# Patient Record
Sex: Female | Born: 1975 | Race: Black or African American | Hispanic: No | Marital: Single | Smoking: Former smoker
Health system: Southern US, Community
[De-identification: ages and names within clinical notes are randomized; demographics above are authoritative.]

---

## 2017-03-21 ENCOUNTER — Ambulatory Visit (INDEPENDENT_AMBULATORY_CARE_PROVIDER_SITE_OTHER): Payer: PRIVATE HEALTH INSURANCE | Admitting: Family Medicine

## 2017-03-21 ENCOUNTER — Encounter: Payer: Self-pay | Admitting: Family Medicine

## 2017-03-21 ENCOUNTER — Other Ambulatory Visit (HOSPITAL_COMMUNITY)
Admission: RE | Admit: 2017-03-21 | Discharge: 2017-03-21 | Disposition: A | Discharge status: Home / Self Care | Payer: PRIVATE HEALTH INSURANCE | Source: Ambulatory Visit | Attending: Family Medicine | Admitting: Family Medicine

## 2017-03-21 VITALS — BP 134/85 | HR 65 | Ht 65.0 in | Wt 219.0 lb

## 2017-03-21 DIAGNOSIS — Z01411 Encounter for gynecological examination (general) (routine) with abnormal findings: Secondary | ICD-10-CM | POA: Diagnosis not present

## 2017-03-21 DIAGNOSIS — R350 Frequency of micturition: Secondary | ICD-10-CM

## 2017-03-21 DIAGNOSIS — L02215 Cutaneous abscess of perineum: Secondary | ICD-10-CM

## 2017-03-21 DIAGNOSIS — Z01419 Encounter for gynecological examination (general) (routine) without abnormal findings: Secondary | ICD-10-CM | POA: Insufficient documentation

## 2017-03-21 MED ORDER — SULFAMETHOXAZOLE-TRIMETHOPRIM 800-160 MG PO TABS: 1.0000 | ORAL_TABLET | Freq: Two times a day (BID) | ORAL | 1 refills | Status: DC

## 2017-03-21 NOTE — Progress Notes (Signed)
GYNECOLOGY ANNUAL PREVENTATIVE CARE ENCOUNTER NOTE  Subjective:   Geannie Risenatricia Tinkey is a 42 y.o. 233P1 female here for a routine annual gynecologic exam.  Current complaints: sore bump on outside of vagina on left. Started 2-3 days ago, ruptured last night with puss and blood.   Denies abnormal vaginal bleeding, discharge, pelvic pain, problems with intercourse or other gynecologic concerns.    Gynecologic History Patient's last menstrual period was 03/07/2017. Patient is sexually active  Contraception: condoms Last Pap: several years ago. Results were: normal Last mammogram: n/a.  Obstetric History OB History  Gravida Para Term Preterm AB Living  3 1          SAB TAB Ectopic Multiple Live Births          1    # Outcome Date GA Lbr Len/2nd Weight Sex Delivery Anes PTL Lv  3 Gravida           2 Gravida           1 Para      Vag-Spont         History reviewed. No pertinent past medical history.  History reviewed. No pertinent surgical history.  No current outpatient medications on file prior to visit.   No current facility-administered medications on file prior to visit.     No Known Allergies  Social History   Socioeconomic History  . Marital status: Married    Spouse name: Not on file  . Number of children: Not on file  . Years of education: Not on file  . Highest education level: Not on file  Social Needs  . Financial resource strain: Not on file  . Food insecurity - worry: Not on file  . Food insecurity - inability: Not on file  . Transportation needs - medical: Not on file  . Transportation needs - non-medical: Not on file  Occupational History  . Not on file  Tobacco Use  . Smoking status: Former Smoker    Last attempt to quit: 03/21/1997    Years since quitting: 20.0  . Smokeless tobacco: Never Used  Substance and Sexual Activity  . Alcohol use: Yes  . Drug use: No  . Sexual activity: Yes  Other Topics Concern  . Not on file  Social History  Narrative  . Not on file    Family History  Problem Relation Age of Onset  . Hypertension Sister   . Cancer Neg Hx   . Diabetes Neg Hx     The following portions of the patient's history were reviewed and updated as appropriate: allergies, current medications, past family history, past medical history, past social history, past surgical history and problem list.  Review of Systems Pertinent items noted in HPI and remainder of comprehensive ROS otherwise negative.   Objective:  BP 134/85   Pulse 65   Ht 5\' 5"  (1.651 m)   Wt 219 lb (99.3 kg)   LMP 03/07/2017   BMI 36.44 kg/m  CONSTITUTIONAL: Well-developed, well-nourished female in no acute distress.  HENT:  Normocephalic, atraumatic, External right and left ear normal. Oropharynx is clear and moist EYES: Conjunctivae and EOM are normal. Pupils are equal, round, and reactive to light. No scleral icterus.  NECK: Normal range of motion, supple, no masses.  Normal thyroid.   CARDIOVASCULAR: Normal heart rate noted, regular rhythm RESPIRATORY: Clear to auscultation bilaterally. Effort and breath sounds normal, no problems with respiration noted. BREASTS: Symmetric in size. No masses, skin changes, nipple drainage, or  lymphadenopathy. ABDOMEN: Soft, normal bowel sounds, no distention noted.  No tenderness, rebound or guarding.  PELVIC: 3cm firm, indurated area on left perineum with erythema to skin; normal appearing vaginal mucosa and cervix.  No abnormal discharge noted.  Pap smear obtained.  Normal uterine size, no other palpable masses, no uterine or adnexal tenderness.  MUSCULOSKELETAL: Normal range of motion. No tenderness.  No cyanosis, clubbing, or edema.  2+ distal pulses. SKIN: Skin is warm and dry. No rash noted. Not diaphoretic. No erythema. No pallor. NEUROLOGIC: Alert and oriented to person, place, and time. Normal reflexes, muscle tone coordination. No cranial nerve deficit noted. PSYCHIATRIC: Normal mood and affect.  Normal behavior. Normal judgment and thought content.  Assessment:  Annual gynecologic examination with pap smear   Plan:  1. Well woman exam with routine gynecological exam Will follow up results of pap smear and manage accordingly. Mammogram scheduled STD testing discussed. Patient requested testing: vaginal cultures and blood testing. Discussed exercise and diet - Cytology - PAP - MM Digital Screening; Future - HIV antibody (with reflex) - Hepatitis B Surface AntiGEN - Hepatitis C Antibody - CBC - TSH - Comprehensive metabolic panel - RPR  2. Urinary frequency - Urine Culture  3. Perineal abscess Bactrim prescribed as continued induration and mild cellulitis. Discussed warm compresses.   Routine preventative health maintenance measures emphasized. Please refer to After Visit Summary for other counseling recommendations.    Candelaria Celeste, DO [facility]

## 2017-03-21 NOTE — Patient Instructions (Signed)

## 2017-03-22 LAB — COMPREHENSIVE METABOLIC PANEL
ALK PHOS: 67 IU/L (ref 39–117)
ALT: 7 IU/L (ref 0–32)
AST: 15 IU/L (ref 0–40)
Albumin/Globulin Ratio: 1.3 (ref 1.2–2.2)
Albumin: 4.1 g/dL (ref 3.5–5.5)
BILIRUBIN TOTAL: 0.3 mg/dL (ref 0.0–1.2)
BUN/Creatinine Ratio: 8 — ABNORMAL LOW (ref 9–23)
BUN: 6 mg/dL (ref 6–24)
CO2: 23 mmol/L (ref 20–29)
CREATININE: 0.75 mg/dL (ref 0.57–1.00)
Calcium: 9.2 mg/dL (ref 8.7–10.2)
Chloride: 103 mmol/L (ref 96–106)
GFR calc Af Amer: 114 mL/min/{1.73_m2} (ref >59)
GFR calc non Af Amer: 99 mL/min/{1.73_m2} (ref >59)
GLOBULIN, TOTAL: 3.1 g/dL (ref 1.5–4.5)
Glucose: 88 mg/dL (ref 65–99)
Potassium: 4.3 mmol/L (ref 3.5–5.2)
Sodium: 140 mmol/L (ref 134–144)
Total Protein: 7.2 g/dL (ref 6.0–8.5)

## 2017-03-22 LAB — HEPATITIS B SURFACE ANTIGEN: Hepatitis B Surface Ag: NEGATIVE

## 2017-03-22 LAB — CBC
HEMATOCRIT: 37.2 % (ref 34.0–46.6)
Hemoglobin: 12.1 g/dL (ref 11.1–15.9)
MCH: 28.7 pg (ref 26.6–33.0)
MCHC: 32.5 g/dL (ref 31.5–35.7)
MCV: 88 fL (ref 79–97)
Platelets: 358 10*3/uL (ref 150–379)
RBC: 4.21 x10E6/uL (ref 3.77–5.28)
RDW: 14.7 % (ref 12.3–15.4)
WBC: 6.1 10*3/uL (ref 3.4–10.8)

## 2017-03-22 LAB — TSH: TSH: 1.81 u[IU]/mL (ref 0.450–4.500)

## 2017-03-22 LAB — RPR: RPR: NONREACTIVE

## 2017-03-22 LAB — HEPATITIS C ANTIBODY: Hep C Virus Ab: <0.1 s/co ratio (ref 0.0–0.9)

## 2017-03-22 LAB — HIV ANTIBODY (ROUTINE TESTING W REFLEX): HIV Screen 4th Generation wRfx: NONREACTIVE

## 2017-03-23 LAB — URINE CULTURE

## 2017-03-24 ENCOUNTER — Other Ambulatory Visit: Payer: Self-pay | Admitting: Family Medicine

## 2017-03-24 ENCOUNTER — Encounter: Payer: Self-pay | Admitting: Family Medicine

## 2017-03-24 MED ORDER — CIPROFLOXACIN HCL 500 MG PO TABS: 500.0000 mg | ORAL_TABLET | Freq: Two times a day (BID) | ORAL | 0 refills | Status: DC

## 2017-03-29 ENCOUNTER — Ambulatory Visit (HOSPITAL_BASED_OUTPATIENT_CLINIC_OR_DEPARTMENT_OTHER)
Admission: RE | Admit: 2017-03-29 | Discharge: 2017-03-29 | Disposition: A | Discharge status: Home / Self Care | Payer: PRIVATE HEALTH INSURANCE | Source: Ambulatory Visit | Attending: Family Medicine | Admitting: Family Medicine

## 2017-03-29 DIAGNOSIS — Z01419 Encounter for gynecological examination (general) (routine) without abnormal findings: Secondary | ICD-10-CM

## 2017-03-29 DIAGNOSIS — Z1231 Encounter for screening mammogram for malignant neoplasm of breast: Secondary | ICD-10-CM | POA: Diagnosis not present

## 2017-03-31 DIAGNOSIS — Z01419 Encounter for gynecological examination (general) (routine) without abnormal findings: Secondary | ICD-10-CM | POA: Diagnosis not present

## 2017-04-01 ENCOUNTER — Other Ambulatory Visit: Payer: Self-pay | Admitting: Family Medicine

## 2017-04-01 LAB — CYTOLOGY - PAP
Adequacy: ABSENT
CHLAMYDIA, DNA PROBE: NEGATIVE
Diagnosis: NEGATIVE
HPV (WINDOPATH): NOT DETECTED
Neisseria Gonorrhea: NEGATIVE

## 2017-04-01 MED ORDER — METRONIDAZOLE 500 MG PO TABS: 500.0000 mg | ORAL_TABLET | Freq: Two times a day (BID) | ORAL | 0 refills | Status: DC

## 2017-04-02 ENCOUNTER — Telehealth: Payer: Self-pay

## 2017-04-02 NOTE — Telephone Encounter (Signed)
Left message for patient to return call to the clinic for results. Alfons Sulkowski RNBSN 

## 2017-04-02 NOTE — Telephone Encounter (Signed)
Patient called back and made aware of trichomonias on pap smear. Patient made aware that she needs to get Flagyl and take medication for treatment. Patient made aware to avoid alchol while taking Flagyl.  Patient questioned how long she has had the infection and made her aware that we dont know that information from the result. We only know that she has it. Patient made aware to abstain from sex 2 weeks after taking antibiotic and her partner should be tested. Armandina StammerJennifer Howard RNBSN

## 2017-04-20 ENCOUNTER — Encounter: Payer: Self-pay | Admitting: Family Medicine

## 2017-04-21 ENCOUNTER — Other Ambulatory Visit: Payer: Self-pay

## 2017-04-21 MED ORDER — FLUCONAZOLE 150 MG PO TABS: 150.0000 mg | ORAL_TABLET | Freq: Once | ORAL | 1 refills | Status: AC

## 2017-05-15 ENCOUNTER — Encounter: Payer: Self-pay | Admitting: Family Medicine

## 2017-08-18 ENCOUNTER — Encounter: Payer: Self-pay | Admitting: Family Medicine

## 2017-08-19 ENCOUNTER — Ambulatory Visit: Payer: PRIVATE HEALTH INSURANCE

## 2018-03-26 ENCOUNTER — Other Ambulatory Visit: Payer: Self-pay

## 2018-03-26 ENCOUNTER — Emergency Department (HOSPITAL_BASED_OUTPATIENT_CLINIC_OR_DEPARTMENT_OTHER)
Admission: EM | Admit: 2018-03-26 | Discharge: 2018-03-26 | Disposition: A | Discharge status: Home / Self Care | Payer: No Typology Code available for payment source | Attending: Emergency Medicine | Admitting: Emergency Medicine

## 2018-03-26 ENCOUNTER — Emergency Department (HOSPITAL_BASED_OUTPATIENT_CLINIC_OR_DEPARTMENT_OTHER): Payer: No Typology Code available for payment source

## 2018-03-26 ENCOUNTER — Encounter (HOSPITAL_BASED_OUTPATIENT_CLINIC_OR_DEPARTMENT_OTHER): Payer: Self-pay

## 2018-03-26 DIAGNOSIS — Y999 Unspecified external cause status: Secondary | ICD-10-CM | POA: Insufficient documentation

## 2018-03-26 DIAGNOSIS — Y929 Unspecified place or not applicable: Secondary | ICD-10-CM | POA: Diagnosis not present

## 2018-03-26 DIAGNOSIS — M25512 Pain in left shoulder: Secondary | ICD-10-CM | POA: Insufficient documentation

## 2018-03-26 DIAGNOSIS — Y9389 Activity, other specified: Secondary | ICD-10-CM | POA: Diagnosis not present

## 2018-03-26 DIAGNOSIS — R0789 Other chest pain: Secondary | ICD-10-CM | POA: Insufficient documentation

## 2018-03-26 DIAGNOSIS — S7012XA Contusion of left thigh, initial encounter: Secondary | ICD-10-CM | POA: Diagnosis not present

## 2018-03-26 DIAGNOSIS — S161XXA Strain of muscle, fascia and tendon at neck level, initial encounter: Secondary | ICD-10-CM | POA: Diagnosis not present

## 2018-03-26 DIAGNOSIS — S0990XA Unspecified injury of head, initial encounter: Secondary | ICD-10-CM | POA: Diagnosis present

## 2018-03-26 DIAGNOSIS — Z87891 Personal history of nicotine dependence: Secondary | ICD-10-CM | POA: Insufficient documentation

## 2018-03-26 MED ORDER — METHOCARBAMOL 500 MG PO TABS: 500.0000 mg | ORAL_TABLET | Freq: Three times a day (TID) | ORAL | 0 refills | Status: DC | PRN
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
## 2018-03-26 NOTE — ED Notes (Signed)
Patient transported to CT 
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
## 2018-03-26 NOTE — ED Notes (Signed)
ED Provider at bedside. 

## 2018-03-26 NOTE — ED Notes (Signed)
Returned from xray

## 2018-03-26 NOTE — Discharge Instructions (Signed)

## 2018-09-06 ENCOUNTER — Emergency Department (HOSPITAL_COMMUNITY)
Admission: EM | Admit: 2018-09-06 | Discharge: 2018-09-06 | Disposition: A | Discharge status: Home / Self Care | Payer: Self-pay | Attending: Emergency Medicine | Admitting: Emergency Medicine

## 2018-09-06 ENCOUNTER — Other Ambulatory Visit: Payer: Self-pay

## 2018-09-06 ENCOUNTER — Emergency Department (HOSPITAL_COMMUNITY): Payer: Self-pay

## 2018-09-06 ENCOUNTER — Encounter (HOSPITAL_COMMUNITY): Payer: Self-pay | Admitting: Emergency Medicine

## 2018-09-06 DIAGNOSIS — Y9301 Activity, walking, marching and hiking: Secondary | ICD-10-CM | POA: Insufficient documentation

## 2018-09-06 DIAGNOSIS — Y929 Unspecified place or not applicable: Secondary | ICD-10-CM | POA: Insufficient documentation

## 2018-09-06 DIAGNOSIS — S93402A Sprain of unspecified ligament of left ankle, initial encounter: Secondary | ICD-10-CM

## 2018-09-06 DIAGNOSIS — X501XXA Overexertion from prolonged static or awkward postures, initial encounter: Secondary | ICD-10-CM | POA: Insufficient documentation

## 2018-09-06 DIAGNOSIS — Y999 Unspecified external cause status: Secondary | ICD-10-CM | POA: Insufficient documentation

## 2018-09-06 DIAGNOSIS — Z87891 Personal history of nicotine dependence: Secondary | ICD-10-CM | POA: Insufficient documentation

## 2018-09-06 DIAGNOSIS — S93492A Sprain of other ligament of left ankle, initial encounter: Secondary | ICD-10-CM | POA: Insufficient documentation

## 2018-09-06 MED ORDER — IBUPROFEN 800 MG PO TABS: 800.0000 mg | ORAL_TABLET | Freq: Three times a day (TID) | ORAL | 0 refills | Status: DC | PRN

## 2018-09-06 NOTE — ED Provider Notes (Signed)
[facility] EMERGENCY DEPARTMENT Provider Note   CSN: [id number] Arrival date & time: 09/06/18  1456     History   Chief Complaint Chief Complaint  Patient presents with  . Ankle Pain    HPI Yvette Campos is a 43 y.o. female.     HPI Patient presents to the emergency department with a left ankle injury that occurred Friday night.  Patient states that she stepped wrong down on the last step in her ankle twisted outward.  Patient states that she had immediate swelling and discomfort.  She states she is having a hard time putting weight on it due to pain.  Patient states that certain movements palpation make the pain worse.  Patient states that she did not take any medications prior to arrival for her symptoms.  Patient denies any other injuries. History reviewed. No pertinent past medical history.  There are no active problems to display for this patient.   History reviewed. No pertinent surgical history.   OB History    Gravida  3   Para  1   Term      Preterm      AB      Living        SAB      TAB      Ectopic      Multiple      Live Births  1            Home Medications    Prior to Admission medications   Medication Sig Start Date End Date Taking? Authorizing Provider  ciprofloxacin (CIPRO) 500 MG tablet Take 1 tablet (500 mg total) by mouth 2 (two) times daily. 03/24/17   Truett Mainland, DO  methocarbamol (ROBAXIN) 500 MG tablet Take 1 tablet (500 mg total) by mouth every 8 (eight) hours as needed for muscle spasms. 03/26/18   Long, Wonda Olds, MD  metroNIDAZOLE (FLAGYL) 500 MG tablet Take 1 tablet (500 mg total) by mouth 2 (two) times daily. 04/01/17   Truett Mainland, DO  sulfamethoxazole-trimethoprim (BACTRIM DS,SEPTRA DS) 800-160 MG tablet Take 1 tablet by mouth 2 (two) times daily. 03/21/17   Truett Mainland, DO    Family History Family History  Problem Relation Age of Onset  . Hypertension Sister   . Cancer Neg Hx   . Diabetes Neg Hx      Social History Social History   Tobacco Use  . Smoking status: Former Smoker    Quit date: 03/21/1997    Years since quitting: 21.4  . Smokeless tobacco: Never Used  Substance Use Topics  . Alcohol use: Yes    Comment: occ  . Drug use: No     Allergies   Patient has no known allergies.   Review of Systems Review of Systems All other systems negative except as documented in the HPI. All pertinent positives and negatives as reviewed in the HPI.  Physical Exam Updated Vital Signs BP (!) 130/99 (BP Location: Right Arm)   Pulse 86   Temp 98 F (36.7 C) (Oral)   Resp 18   Ht 5\' 5"  (1.651 m)   Wt 79.4 kg   LMP 08/18/2018   SpO2 100%   BMI 29.12 kg/m   Physical Exam Vitals signs and nursing note reviewed.  Constitutional:      General: She is not in acute distress.    Appearance: She is well-developed.  HENT:     Head: Normocephalic and atraumatic.  Eyes:  Pupils: Pupils are equal, round, and reactive to light.  Pulmonary:     Effort: Pulmonary effort is normal.  Musculoskeletal:     Left ankle: She exhibits decreased range of motion and swelling. She exhibits no deformity and normal pulse. Tenderness. Lateral malleolus and medial malleolus tenderness found. Achilles tendon normal.  Skin:    General: Skin is warm and dry.  Neurological:     Mental Status: She is alert and oriented to person, place, and time.      ED Treatments / Results  Labs (all labs ordered are listed, but only abnormal results are displayed) Labs Reviewed - No data to display  EKG None  Radiology Dg Ankle Complete Left  Result Date: 09/06/2018 CLINICAL DATA:  FALL, LEFT ANKLE PAIN, PER ER NOTE, Pt states that she fell yesterday and hurt her left ankle EXAM: LEFT ANKLE COMPLETE - 3+ VIEW COMPARISON:  None. FINDINGS: There is diffuse soft tissue swelling of the ankle. No acute fracture or subluxation. IMPRESSION: Soft tissue swelling. Electronically Signed   By: Norva PavlovElizabeth  Brown  M.D.   On: 09/06/2018 15:33    Procedures Procedures (including critical care time)  Medications Ordered in ED Medications - No data to display   Initial Impression / Assessment and Plan / ED Course  I have reviewed the triage vital signs and the nursing notes.  Pertinent labs & imaging results that were available during my care of the patient were reviewed by me and considered in my medical decision making (see chart for details).        Patient has a sprain of her left ankle.  Patient will be advised ice and elevate the ankle.  I told her to follow-up with orthopedics if not improving over the next 2 weeks.  Patient is advised to return here for any worsening in her condition.  Final Clinical Impressions(s) / ED Diagnoses   Final diagnoses:  None    ED Discharge Orders    None       Charlestine NightLawyer, Reyn Faivre, PA-C 09/06/18 1541    Terrilee FilesButler, Michael C, MD 09/07/18 1001

## 2018-09-06 NOTE — ED Triage Notes (Signed)
Pt states that she fell yesterday and hurt her left ankle

## 2018-09-06 NOTE — Discharge Instructions (Addendum)
Return here as needed.  Follow-up with the orthopedist provided.  Ice and elevate your ankle.

## 2018-10-08 ENCOUNTER — Telehealth: Payer: Medicaid Other | Admitting: Physician Assistant

## 2018-10-08 DIAGNOSIS — R52 Pain, unspecified: Secondary | ICD-10-CM

## 2018-10-08 DIAGNOSIS — R509 Fever, unspecified: Secondary | ICD-10-CM

## 2018-10-08 DIAGNOSIS — R0602 Shortness of breath: Secondary | ICD-10-CM

## 2018-10-08 MED ORDER — ALBUTEROL SULFATE HFA 108 (90 BASE) MCG/ACT IN AERS: 2.0000 | INHALATION_SPRAY | RESPIRATORY_TRACT | 0 refills | Status: DC | PRN

## 2018-10-08 NOTE — Progress Notes (Signed)
E-Visit for Corona Virus Screening   Your current symptoms could be consistent with the coronavirus.  Many health care providers can now test patients at their office but not all are.  [place] has multiple testing sites. For information on our COVID testing locations and hours go to [email or web address]  Please quarantine yourself while awaiting your test results.  We are enrolling you in our MyChart Home Montioring for COVID19 . Daily you will receive a questionnaire within the MyChart website. Our COVID 19 response team willl be monitoriing your responses daily.    COVID-19 is a respiratory illness with symptoms that are similar to the flu. Symptoms are typically mild to moderate, but there have been cases of severe illness and death due to the virus. The following symptoms may appear 2-14 days after exposure: . Fever . Cough . Shortness of breath or difficulty breathing . Chills . Repeated shaking with chills . Muscle pain . Headache . Sore throat . New loss of taste or smell . Fatigue . Congestion or runny nose . Nausea or vomiting . Diarrhea  It is vitally important that if you feel that you have an infection such as this virus or any other virus that you stay home and away from places where you may spread it to others.  You should self-quarantine for 14 days if you have symptoms that could potentially be coronavirus or have been in close contact a with a person diagnosed with COVID-19 within the last 2 weeks. You should avoid contact with people age 43 and older.   You should wear a mask or cloth face covering over your nose and mouth if you must be around other people or animals, including pets (even at home). Try to stay at least 6 feet away from other people. This will protect the people around you.  You can use medication such as A prescription inhaler called Albuterol MDI 90 mcg /actuation 2 puffs every 4 hours as needed for shortness of breath,  wheezing, cough   If you have or develop a cough, you may use cough suppressants such as Delsym and Robitussin.  If you have glaucoma or high blood pressure, you can also use Coricidin HBP.  You may also take acetaminophen (Tylenol) as needed for fever.   Reduce your risk of any infection by using the same precautions used for avoiding the common cold or flu:  Marland Kitchen. Wash your hands often with soap and warm water for at least 20 seconds.  If soap and water are not readily available, use an alcohol-based hand sanitizer with at least 60% alcohol.  . If coughing or sneezing, cover your mouth and nose by coughing or sneezing into the elbow areas of your shirt or coat, into a tissue or into your sleeve (not your hands). . Avoid shaking hands with others and consider head nods or verbal greetings only. . Avoid touching your eyes, nose, or mouth with unwashed hands.  . Avoid close contact with people who are sick. . Avoid places or events with large numbers of people in one location, like concerts or sporting events. . Carefully consider travel plans you have or are making. . If you are planning any travel outside or inside the [place], visit the CDC's Travelers' Health webpage for the latest health notices. . If you have some symptoms but not all symptoms, continue to monitor at home and seek medical attention if your symptoms worsen. . If you are having a medical emergency, call 911.  HOME  CARE . Only take medications as instructed by your medical team. . Drink plenty of fluids and get plenty of rest. . A steam or ultrasonic humidifier can help if you have congestion.   GET HELP RIGHT AWAY IF YOU HAVE EMERGENCY WARNING SIGNS** FOR COVID-19. If you or someone is showing any of these signs seek emergency medical care immediately. Call 911 or proceed to your closest emergency facility if: . You develop worsening high fever. . Trouble breathing . Bluish lips or face . Persistent pain or pressure in the  chest . New confusion . Inability to wake or stay awake . You cough up blood. . Your symptoms become more severe  **This list is not all possible symptoms. Contact your medical provider for any symptoms that are sever or concerning to you.   MAKE SURE YOU   Understand these instructions.  Will watch your condition.  Will get help right away if you are not doing well or get worse.  Your e-visit answers were reviewed by a board certified advanced clinical practitioner to complete your personal care plan.  Depending on the condition, your plan could have included both over the counter or prescription medications.  If there is a problem please reply once you have received a response from your provider.  Your safety is important to [place].  If you have drug allergies check your prescription carefully.    You can use MyChart to ask questions about today's visit, request a non-urgent call back, or ask for a work or school excuse for 24 hours related to this e-Visit. If it has been greater than 24 hours you will need to follow up with your provider, or enter a new e-Visit to address those concerns. You will get an e-mail in the next two days asking about your experience.  I hope that your e-visit has been valuable and will speed your recovery. Thank you for using e-visits.   I have spent 5 minutes in review of e-visit questionnaire, review and updating patient chart, medical decision making and response to patient.    Tenna Delaine, PA-C

## 2018-10-15 ENCOUNTER — Telehealth: Payer: Medicaid Other | Admitting: Physician Assistant

## 2018-10-15 DIAGNOSIS — J029 Acute pharyngitis, unspecified: Secondary | ICD-10-CM

## 2018-10-15 DIAGNOSIS — R509 Fever, unspecified: Secondary | ICD-10-CM

## 2018-10-15 NOTE — Progress Notes (Signed)
Hi Yvette Campos,   Thank you for the details you included in the comment boxes. Those details are very helpful in determining the best course of treatment for you and help [place] to provide the best care. I see you had an e-visit with Tenna Delaine, PA-C on Aug 27 for these same symptoms, were advised to be tested for COVID-19 and were prescribed an inhaler.  If your shortness of breath and/or sore throat are not improving or are worsening, I feel this warrants further evaluation and I recommend that you be seen for a face to face office visit.  NOTE: If you entered your credit card information for this eVisit, you will not be charged. You may see a "hold" on your card for the $35 but that hold will drop off and you will not have a charge processed.  If you are having a true medical emergency please call 911.     For an urgent face to face visit, [place] has four urgent care centers for your convenience:   . [facility]    [phone]                  Get Driving Directions  [address], [place] [address] . 10 am to 8 pm Monday-Friday . 12 pm to 8 pm Saturday-Sunday   . [facility] [place]                  Get Driving Directions  [address], [place], [place] [address] . 8 am to 8 pm Monday-Friday . 9 am to 6 pm Saturday . 11 am to 6 pm Sunday   . [facility] at [place]                  Get Driving Directions   [address], [place] [address] . 8 am to 8 pm Monday-Friday . 8 am to 4 pm Saturday-Sunday    . [facility] [place]                    Get Driving Directions  [phone]  [address], [place], [place] [address]  . Monday-Friday, 12 PM to 6 PM    Your e-visit answers were reviewed by a board certified advanced clinical practitioner to complete your personal care plan.  Thank you for using  e-Visits.

## 2020-04-06 ENCOUNTER — Ambulatory Visit: Payer: Medicaid Other | Admitting: Family Medicine

## 2020-04-12 ENCOUNTER — Other Ambulatory Visit (HOSPITAL_COMMUNITY)
Admission: RE | Admit: 2020-04-12 | Discharge: 2020-04-12 | Disposition: A | Discharge status: Home / Self Care | Payer: No Typology Code available for payment source | Source: Ambulatory Visit | Attending: Family Medicine | Admitting: Family Medicine

## 2020-04-12 ENCOUNTER — Ambulatory Visit (INDEPENDENT_AMBULATORY_CARE_PROVIDER_SITE_OTHER): Payer: No Typology Code available for payment source | Admitting: Family Medicine

## 2020-04-12 ENCOUNTER — Other Ambulatory Visit: Payer: Self-pay

## 2020-04-12 ENCOUNTER — Encounter: Payer: Self-pay | Admitting: Family Medicine

## 2020-04-12 VITALS — BP 135/84 | HR 92 | Ht 65.0 in | Wt 202.1 lb

## 2020-04-12 DIAGNOSIS — N852 Hypertrophy of uterus: Secondary | ICD-10-CM | POA: Diagnosis not present

## 2020-04-12 DIAGNOSIS — Z01419 Encounter for gynecological examination (general) (routine) without abnormal findings: Secondary | ICD-10-CM | POA: Insufficient documentation

## 2020-04-12 DIAGNOSIS — Z113 Encounter for screening for infections with a predominantly sexual mode of transmission: Secondary | ICD-10-CM

## 2020-04-12 DIAGNOSIS — Z1231 Encounter for screening mammogram for malignant neoplasm of breast: Secondary | ICD-10-CM

## 2020-04-12 DIAGNOSIS — Z1211 Encounter for screening for malignant neoplasm of colon: Secondary | ICD-10-CM

## 2020-04-12 NOTE — Progress Notes (Signed)
GYNECOLOGY ANNUAL PREVENTATIVE CARE ENCOUNTER NOTE  Subjective:   Yvette Campos is a 45 y.o. G64P1 female here for a routine annual gynecologic exam.  Current complaints: hot at night. Wonders if she is perimenopausal. No hot flashes, mood irritability, vaginal dryness.   Denies abnormal vaginal bleeding, discharge, pelvic pain, problems with intercourse or other gynecologic concerns.    Gynecologic History Patient's last menstrual period was 04/10/2020. Patient is  sexually active  Contraception: none Last Pap: 2019. Results were: normal Last mammogram: 2019. Results were: normal  Obstetric History OB History  Gravida Para Term Preterm AB Living  3 1          SAB IAB Ectopic Multiple Live Births          1    # Outcome Date GA Lbr Len/2nd Weight Sex Delivery Anes PTL Lv  3 Gravida           2 Gravida           1 Para      Vag-Spont       No past medical history on file.  No past surgical history on file.  Current Outpatient Medications on File Prior to Visit  Medication Sig Dispense Refill  . albuterol (VENTOLIN HFA) 108 (90 Base) MCG/ACT inhaler Inhale 2 puffs into the lungs every 4 (four) hours as needed for wheezing or shortness of breath (cough, shortness of breath or wheezing.). (Patient not taking: Reported on 04/12/2020) 6.7 g 0  . ciprofloxacin (CIPRO) 500 MG tablet Take 1 tablet (500 mg total) by mouth 2 (two) times daily. (Patient not taking: Reported on 04/12/2020) 6 tablet 0  . ibuprofen (ADVIL) 800 MG tablet Take 1 tablet (800 mg total) by mouth every 8 (eight) hours as needed. (Patient not taking: Reported on 04/12/2020) 30 tablet 0  . methocarbamol (ROBAXIN) 500 MG tablet Take 1 tablet (500 mg total) by mouth every 8 (eight) hours as needed for muscle spasms. (Patient not taking: Reported on 04/12/2020) 20 tablet 0  . metroNIDAZOLE (FLAGYL) 500 MG tablet Take 1 tablet (500 mg total) by mouth 2 (two) times daily. (Patient not taking: Reported on 04/12/2020) 14 tablet 0   . sulfamethoxazole-trimethoprim (BACTRIM DS,SEPTRA DS) 800-160 MG tablet Take 1 tablet by mouth 2 (two) times daily. (Patient not taking: Reported on 04/12/2020) 14 tablet 1   No current facility-administered medications on file prior to visit.    No Known Allergies  Social History   Socioeconomic History  . Marital status: Married    Spouse name: Not on file  . Number of children: Not on file  . Years of education: Not on file  . Highest education level: Not on file  Occupational History  . Not on file  Tobacco Use  . Smoking status: Former Smoker    Quit date: 03/21/1997    Years since quitting: 23.0  . Smokeless tobacco: Never Used  Vaping Use  . Vaping Use: Never used  Substance and Sexual Activity  . Alcohol use: Yes    Comment: occ  . Drug use: No  . Sexual activity: Not on file  Other Topics Concern  . Not on file  Social History Narrative  . Not on file   Social Determinants of Health   Financial Resource Strain: Not on file  Food Insecurity: Not on file  Transportation Needs: Not on file  Physical Activity: Not on file  Stress: Not on file  Social Connections: Not on file  Intimate Partner  Violence: Not on file    Family History  Problem Relation Age of Onset  . Hypertension Sister   . Cancer Neg Hx   . Diabetes Neg Hx     The following portions of the patient's history were reviewed and updated as appropriate: allergies, current medications, past family history, past medical history, past social history, past surgical history and problem list.  Review of Systems Pertinent items are noted in HPI.   Objective:  BP 135/84   Pulse 92   Ht 5\' 5"  (1.651 m)   Wt 202 lb 1.9 oz (91.7 kg)   LMP 04/10/2020   BMI 33.63 kg/m  Wt Readings from Last 3 Encounters:  04/12/20 202 lb 1.9 oz (91.7 kg)  09/06/18 175 lb (79.4 kg)  03/26/18 213 lb (96.6 kg)     Chaperone present during exam  CONSTITUTIONAL: Well-developed, well-nourished female in no acute  distress.  HENT:  Normocephalic, atraumatic, External right and left ear normal. Oropharynx is clear and moist EYES: Conjunctivae and EOM are normal. Pupils are equal, round, and reactive to light. No scleral icterus.  NECK: Normal range of motion, supple, no masses.  Normal thyroid.   CARDIOVASCULAR: Normal heart rate noted, regular rhythm RESPIRATORY: Clear to auscultation bilaterally. Effort and breath sounds normal, no problems with respiration noted. BREASTS: Symmetric in size. No masses, skin changes, nipple drainage, or lymphadenopathy. ABDOMEN: Soft, normal bowel sounds, no distention noted.  No tenderness, rebound or guarding.  PELVIC: Normal appearing external genitalia; normal appearing vaginal mucosa and cervix.  No abnormal discharge noted.  16 week uterine size, no other palpable masses, no uterine or adnexal tenderness. MUSCULOSKELETAL: Normal range of motion. No tenderness.  No cyanosis, clubbing, or edema.  2+ distal pulses. SKIN: Skin is warm and dry. No rash noted. Not diaphoretic. No erythema. No pallor. NEUROLOGIC: Alert and oriented to person, place, and time. Normal reflexes, muscle tone coordination. No cranial nerve deficit noted. PSYCHIATRIC: Normal mood and affect. Normal behavior. Normal judgment and thought content.  Assessment:  Annual gynecologic examination with pap smear   Plan:  1. Well Woman Exam Will follow up results of pap smear and manage accordingly. Mammogram scheduled STD testing discussed. Patient requested testing - Cytology - PAP( [place]) - MM DIGITAL SCREENING BILATERAL; Future - Ambulatory referral to Gastroenterology - HIV antibody (with reflex) - Hepatitis C Antibody - Hepatitis B Surface AntiGEN - RPR  2. Routine screening for STI (sexually transmitted infection) - HIV antibody (with reflex) - Hepatitis C Antibody - Hepatitis B Surface AntiGEN - RPR  3. Breast cancer screening by mammogram - MM DIGITAL SCREENING BILATERAL;  Future  4. Screening for colon cancer - Ambulatory referral to Gastroenterology  5. Enlarged uterus Check 03/28/18 - US PELVIS (TRANSABDOMINAL ONLY); Future - US PELVIS TRANSVAGINAL NON-OB (TV ONLY); Future   Routine preventative health maintenance measures emphasized. Please refer to After Visit Summary for other counseling recommendations.    [place], DO [facility]

## 2020-04-13 LAB — HIV ANTIBODY (ROUTINE TESTING W REFLEX): HIV Screen 4th Generation wRfx: NONREACTIVE

## 2020-04-13 LAB — HEPATITIS C ANTIBODY: Hep C Virus Ab: <0.1 s/co ratio (ref 0.0–0.9)

## 2020-04-13 LAB — RPR: RPR Ser Ql: NONREACTIVE

## 2020-04-13 LAB — HEPATITIS B SURFACE ANTIGEN: Hepatitis B Surface Ag: NEGATIVE

## 2020-04-14 LAB — CYTOLOGY - PAP
Adequacy: ABSENT
Chlamydia: NEGATIVE
Comment: NEGATIVE
Comment: NEGATIVE
Comment: NORMAL
Diagnosis: NEGATIVE
High risk HPV: NEGATIVE
Neisseria Gonorrhea: NEGATIVE

## 2020-04-19 ENCOUNTER — Ambulatory Visit (HOSPITAL_BASED_OUTPATIENT_CLINIC_OR_DEPARTMENT_OTHER): Payer: No Typology Code available for payment source

## 2020-05-03 ENCOUNTER — Inpatient Hospital Stay (HOSPITAL_BASED_OUTPATIENT_CLINIC_OR_DEPARTMENT_OTHER): Admission: RE | Admit: 2020-05-03 | Payer: No Typology Code available for payment source | Source: Ambulatory Visit

## 2020-05-03 ENCOUNTER — Other Ambulatory Visit (HOSPITAL_BASED_OUTPATIENT_CLINIC_OR_DEPARTMENT_OTHER): Payer: Self-pay | Admitting: Family Medicine

## 2020-05-03 ENCOUNTER — Ambulatory Visit (HOSPITAL_BASED_OUTPATIENT_CLINIC_OR_DEPARTMENT_OTHER): Payer: No Typology Code available for payment source

## 2020-05-03 ENCOUNTER — Ambulatory Visit (HOSPITAL_BASED_OUTPATIENT_CLINIC_OR_DEPARTMENT_OTHER): Admission: RE | Admit: 2020-05-03 | Payer: No Typology Code available for payment source | Source: Ambulatory Visit

## 2020-05-03 DIAGNOSIS — R102 Pelvic and perineal pain: Secondary | ICD-10-CM

## 2020-05-10 ENCOUNTER — Ambulatory Visit (HOSPITAL_BASED_OUTPATIENT_CLINIC_OR_DEPARTMENT_OTHER): Payer: No Typology Code available for payment source

## 2020-05-10 ENCOUNTER — Ambulatory Visit (HOSPITAL_BASED_OUTPATIENT_CLINIC_OR_DEPARTMENT_OTHER): Payer: No Typology Code available for payment source | Attending: Family Medicine

## 2021-03-01 ENCOUNTER — Encounter: Payer: Self-pay | Admitting: General Practice

## 2021-05-03 ENCOUNTER — Other Ambulatory Visit: Payer: Self-pay

## 2021-05-03 ENCOUNTER — Other Ambulatory Visit (HOSPITAL_COMMUNITY)
Admission: RE | Admit: 2021-05-03 | Discharge: 2021-05-03 | Disposition: A | Discharge status: Home / Self Care | Payer: Managed Care, Other (non HMO) | Source: Ambulatory Visit | Attending: Family Medicine | Admitting: Family Medicine

## 2021-05-03 ENCOUNTER — Encounter: Payer: Self-pay | Admitting: Family Medicine

## 2021-05-03 ENCOUNTER — Ambulatory Visit (INDEPENDENT_AMBULATORY_CARE_PROVIDER_SITE_OTHER): Payer: Managed Care, Other (non HMO) | Admitting: Family Medicine

## 2021-05-03 VITALS — BP 132/87 | HR 94 | Ht 65.0 in | Wt 224.0 lb

## 2021-05-03 DIAGNOSIS — Z01419 Encounter for gynecological examination (general) (routine) without abnormal findings: Secondary | ICD-10-CM

## 2021-05-03 DIAGNOSIS — Z1231 Encounter for screening mammogram for malignant neoplasm of breast: Secondary | ICD-10-CM

## 2021-05-03 DIAGNOSIS — N760 Acute vaginitis: Secondary | ICD-10-CM | POA: Insufficient documentation

## 2021-05-03 DIAGNOSIS — Z7721 Contact with and (suspected) exposure to potentially hazardous body fluids: Secondary | ICD-10-CM | POA: Insufficient documentation

## 2021-05-03 NOTE — Progress Notes (Signed)
? ? ?GYNECOLOGY ANNUAL PREVENTATIVE CARE ENCOUNTER NOTE ? ?Subjective:  ? Yvette Campos is a 46 y.o. G33P1 female here for a routine annual gynecologic exam.  Current complaints: none. Would like STI testing.   Denies abnormal vaginal bleeding, discharge, pelvic pain, problems with intercourse or other gynecologic concerns.  ?  ?Gynecologic History ?Patient's last menstrual period was 04/16/2021. ?Patient is sexually active  ?Last Pap: 2022. Results were: normal ?Last mammogram: 2019. Results were: normal ? ?The pregnancy intention screening data noted above was reviewed. Potential methods of contraception were discussed. The patient elected to proceed with No data recorded. ? ? ?Obstetric History ?OB History  ?Gravida Para Term Preterm AB Living  ?3 1          ?SAB IAB Ectopic Multiple Live Births  ?        1  ?  ?# Outcome Date GA Lbr Len/2nd Weight Sex Delivery Anes PTL Lv  ?3 Gravida           ?2 Gravida           ?1 Para      Vag-Spont     ? ? ?History reviewed. No pertinent past medical history. ? ?History reviewed. No pertinent surgical history. ? ?Current Outpatient Medications on File Prior to Visit  ?Medication Sig Dispense Refill  ? ibuprofen (ADVIL) 800 MG tablet Take 1 tablet (800 mg total) by mouth every 8 (eight) hours as needed. (Patient not taking: Reported on 04/12/2020) 30 tablet 0  ? ?No current facility-administered medications on file prior to visit.  ? ? ?No Known Allergies ? ?Social History  ? ?Socioeconomic History  ? Marital status: Single  ?  Spouse name: Not on file  ? Number of children: Not on file  ? Years of education: Not on file  ? Highest education level: Not on file  ?Occupational History  ? Not on file  ?Tobacco Use  ? Smoking status: Former  ?  Types: Cigarettes  ?  Quit date: 03/21/1997  ?  Years since quitting: 24.1  ? Smokeless tobacco: Never  ?Vaping Use  ? Vaping Use: Never used  ?Substance and Sexual Activity  ? Alcohol use: Yes  ?  Comment: occ  ? Drug use: Not Currently  ?   Types: Marijuana  ? Sexual activity: Yes  ?  Birth control/protection: None  ?Other Topics Concern  ? Not on file  ?Social History Narrative  ? Not on file  ? ?Social Determinants of Health  ? ?Financial Resource Strain: Not on file  ?Food Insecurity: Not on file  ?Transportation Needs: Not on file  ?Physical Activity: Not on file  ?Stress: Not on file  ?Social Connections: Not on file  ?Intimate Partner Violence: Not on file  ? ? ?Family History  ?Problem Relation Age of Onset  ? Cancer Mother   ? Hypertension Sister   ? Cancer Paternal Uncle   ? Diabetes Neg Hx   ? ? ?The following portions of the patient's history were reviewed and updated as appropriate: allergies, current medications, past family history, past medical history, past social history, past surgical history and problem list. ? ?Review of Systems ?Pertinent items are noted in HPI. ?  ?Objective:  ?BP 132/87   Pulse 94   Ht 5\' 5"  (1.651 m)   Wt 224 lb (101.6 kg)   LMP 04/16/2021   BMI 37.28 kg/m?  ?Wt Readings from Last 3 Encounters:  ?05/03/21 224 lb (101.6 kg)  ?04/12/20 202 lb 1.9  oz (91.7 kg)  ?09/06/18 175 lb (79.4 kg)  ?  ? ?Chaperone present during exam ? ?CONSTITUTIONAL: Well-developed, well-nourished female in no acute distress.  ?HENT:  Normocephalic, atraumatic, External right and left ear normal. Oropharynx is clear and moist ?EYES: Conjunctivae and EOM are normal. Pupils are equal, round, and reactive to light. No scleral icterus.  ?NECK: Normal range of motion, supple, no masses.  Normal thyroid.  ? ?CARDIOVASCULAR: Normal heart rate noted, regular rhythm ?RESPIRATORY: Clear to auscultation bilaterally. Effort and breath sounds normal, no problems with respiration noted. ?BREASTS: Symmetric in size. No masses, skin changes, nipple drainage, or lymphadenopathy. ?ABDOMEN: Soft, normal bowel sounds, no distention noted.  No tenderness, rebound or guarding.  ?PELVIC: Normal appearing external genitalia; normal appearing vaginal mucosa  and cervix.  No abnormal discharge noted.  ?MUSCULOSKELETAL: Normal range of motion. No tenderness.  No cyanosis, clubbing, or edema.  2+ distal pulses. ?SKIN: Skin is warm and dry. No rash noted. Not diaphoretic. No erythema. No pallor. ?NEUROLOGIC: Alert and oriented to person, place, and time. Normal reflexes, muscle tone coordination. No cranial nerve deficit noted. ?PSYCHIATRIC: Normal mood and affect. Normal behavior. Normal judgment and thought content. ? ?Assessment:  ?Annual gynecologic examination with pap smear ?  ?Plan:  ?1. Well Woman Exam ?Will follow up results of pap smear and manage accordingly. ?Mammogram scheduled ?STD testing discussed. Patient requested testing ? ?2. Exposure to body fluid ?- Cervicovaginal ancillary only( [place]) ?- HIV Antibody (routine testing w rflx) ?- Hepatitis B surface antigen ?- RPR ?- Hepatitis C antibody ? ?3. Breast cancer screening by mammogram ?Mammogragm ordered ? ? ?Routine preventative health maintenance measures emphasized. ?Please refer to After Visit Summary for other counseling recommendations.  ? ? ?[facility]andel[facility] Celeste, DO ?Center for [facility] ? ?

## 2021-05-03 NOTE — Progress Notes (Signed)
Pt in office for AEX - doing well, no concerns. ?Pt does need mammo, last mammo 2019. ? ?Pt would like STD screening with labs today.  ? ?PHQ score 1.  ?GAD score 3.  ? ? ?

## 2021-05-04 LAB — HEPATITIS B SURFACE ANTIGEN: Hepatitis B Surface Ag: NEGATIVE

## 2021-05-04 LAB — CERVICOVAGINAL ANCILLARY ONLY
Bacterial Vaginitis (gardnerella): POSITIVE — AB
Candida Glabrata: NEGATIVE
Candida Vaginitis: NEGATIVE
Chlamydia: NEGATIVE
Comment: NEGATIVE
Comment: NEGATIVE
Comment: NEGATIVE
Comment: NEGATIVE
Comment: NEGATIVE
Comment: NORMAL
Neisseria Gonorrhea: NEGATIVE
Trichomonas: NEGATIVE

## 2021-05-04 LAB — HEPATITIS C ANTIBODY: Hep C Virus Ab: NONREACTIVE

## 2021-05-04 LAB — HIV ANTIBODY (ROUTINE TESTING W REFLEX): HIV Screen 4th Generation wRfx: NONREACTIVE

## 2021-05-04 LAB — RPR: RPR Ser Ql: NONREACTIVE

## 2021-05-04 MED ORDER — METRONIDAZOLE 500 MG PO TABS: 500.0000 mg | ORAL_TABLET | Freq: Two times a day (BID) | ORAL | 0 refills | Status: DC

## 2021-05-04 NOTE — Addendum Note (Signed)
Addended by: Levie Heritage on: 05/04/2021 12:58 PM ? ? Modules accepted: Orders ? ?

## 2021-05-07 ENCOUNTER — Encounter (HOSPITAL_BASED_OUTPATIENT_CLINIC_OR_DEPARTMENT_OTHER): Payer: Self-pay

## 2021-05-07 ENCOUNTER — Ambulatory Visit (HOSPITAL_BASED_OUTPATIENT_CLINIC_OR_DEPARTMENT_OTHER)
Admission: RE | Admit: 2021-05-07 | Discharge: 2021-05-07 | Disposition: A | Discharge status: Home / Self Care | Payer: Managed Care, Other (non HMO) | Source: Ambulatory Visit | Attending: Family Medicine | Admitting: Family Medicine

## 2021-05-07 ENCOUNTER — Other Ambulatory Visit: Payer: Self-pay

## 2021-05-07 DIAGNOSIS — Z1231 Encounter for screening mammogram for malignant neoplasm of breast: Secondary | ICD-10-CM | POA: Diagnosis not present

## 2021-05-08 ENCOUNTER — Other Ambulatory Visit: Payer: Self-pay | Admitting: Family Medicine

## 2021-05-08 DIAGNOSIS — R928 Other abnormal and inconclusive findings on diagnostic imaging of breast: Secondary | ICD-10-CM

## 2021-05-10 ENCOUNTER — Encounter: Payer: Self-pay | Admitting: General Practice

## 2021-05-23 ENCOUNTER — Ambulatory Visit
Admission: RE | Admit: 2021-05-23 | Discharge: 2021-05-23 | Disposition: A | Discharge status: Home / Self Care | Payer: Managed Care, Other (non HMO) | Source: Ambulatory Visit | Attending: Family Medicine | Admitting: Family Medicine

## 2021-05-23 DIAGNOSIS — R928 Other abnormal and inconclusive findings on diagnostic imaging of breast: Secondary | ICD-10-CM

## 2021-05-28 ENCOUNTER — Other Ambulatory Visit (HOSPITAL_COMMUNITY)
Admission: RE | Admit: 2021-05-28 | Discharge: 2021-05-28 | Disposition: A | Discharge status: Home / Self Care | Payer: Managed Care, Other (non HMO) | Source: Ambulatory Visit | Attending: Obstetrics & Gynecology | Admitting: Obstetrics & Gynecology

## 2021-05-28 ENCOUNTER — Ambulatory Visit (INDEPENDENT_AMBULATORY_CARE_PROVIDER_SITE_OTHER): Payer: Managed Care, Other (non HMO)

## 2021-05-28 VITALS — BP 142/88 | HR 87

## 2021-05-28 DIAGNOSIS — Z113 Encounter for screening for infections with a predominantly sexual mode of transmission: Secondary | ICD-10-CM

## 2021-05-28 DIAGNOSIS — R300 Dysuria: Secondary | ICD-10-CM

## 2021-05-28 MED ORDER — NITROFURANTOIN MONOHYD MACRO 100 MG PO CAPS: 100.0000 mg | ORAL_CAPSULE | Freq: Two times a day (BID) | ORAL | 0 refills | Status: DC

## 2021-05-28 NOTE — Addendum Note (Signed)
Addended by: Anell Barr on: 05/28/2021 04:03 PM ? ? Modules accepted: Orders ? ?

## 2021-05-28 NOTE — Progress Notes (Cosign Needed)
SUBJECTIVE: Yvette Campos is a 46 y.o. female who complains of urinary frequency, urgency and dysuria x 5 days, without flank pain, fever, chills, or abnormal vaginal discharge or bleeding.  ? ?OBJECTIVE: Appears well, in no apparent distress.  Vital signs are normal. Urine dipstick shows positive for WBC's, positive for nitrates, positive for leukocytes, and positive for ketones.  Patient states she is currently being treat with flagyl for bacterial vaginosis for last three days. ? ?ASSESSMENT: Dysuria. Urine is dark brown in color.  ? ?PLAN: Treatment per orders.  Patient also requests that I run GC/Chl off her urine.  ?

## 2021-05-29 LAB — GC/CHLAMYDIA PROBE AMP (~~LOC~~) NOT AT ARMC
Chlamydia: NEGATIVE
Comment: NEGATIVE
Comment: NORMAL
Neisseria Gonorrhea: NEGATIVE

## 2021-05-30 ENCOUNTER — Other Ambulatory Visit: Payer: Self-pay

## 2021-05-30 ENCOUNTER — Encounter: Payer: Self-pay | Admitting: Family Medicine

## 2021-05-30 LAB — URINE CULTURE

## 2021-05-30 MED ORDER — PHENAZOPYRIDINE HCL 200 MG PO TABS: 200.0000 mg | ORAL_TABLET | Freq: Three times a day (TID) | ORAL | 0 refills | Status: DC | PRN

## 2021-05-30 MED ORDER — PHENAZOPYRIDINE HCL 200 MG PO TABS
200.0000 mg | ORAL_TABLET | Freq: Three times a day (TID) | ORAL | 0 refills | Status: DC | PRN
Start: 2021-05-30 — End: 2021-05-30

## 2021-06-19 ENCOUNTER — Encounter: Payer: Self-pay | Admitting: Family Medicine

## 2021-06-19 ENCOUNTER — Other Ambulatory Visit: Payer: Self-pay | Admitting: Obstetrics & Gynecology

## 2021-06-19 MED ORDER — CEFADROXIL 500 MG PO CAPS: 500.0000 mg | ORAL_CAPSULE | Freq: Two times a day (BID) | ORAL | 0 refills | Status: AC

## 2022-02-12 ENCOUNTER — Encounter: Payer: Self-pay | Admitting: Internal Medicine

## 2022-02-12 ENCOUNTER — Ambulatory Visit (INDEPENDENT_AMBULATORY_CARE_PROVIDER_SITE_OTHER): Payer: Commercial Managed Care - HMO | Admitting: Internal Medicine

## 2022-02-12 VITALS — BP 140/100 | HR 90 | Temp 98.0°F | Ht 65.0 in | Wt 226.0 lb

## 2022-02-12 DIAGNOSIS — Z1211 Encounter for screening for malignant neoplasm of colon: Secondary | ICD-10-CM | POA: Diagnosis not present

## 2022-02-12 DIAGNOSIS — Z0000 Encounter for general adult medical examination without abnormal findings: Secondary | ICD-10-CM

## 2022-02-12 DIAGNOSIS — R030 Elevated blood-pressure reading, without diagnosis of hypertension: Secondary | ICD-10-CM

## 2022-02-12 DIAGNOSIS — Z01818 Encounter for other preprocedural examination: Secondary | ICD-10-CM | POA: Diagnosis not present

## 2022-02-12 LAB — CBC
HCT: 37.1 % (ref 36.0–46.0)
Hemoglobin: 12.3 g/dL (ref 12.0–15.0)
MCHC: 33.3 g/dL (ref 30.0–36.0)
MCV: 81.8 fl (ref 78.0–100.0)
Platelets: 321 10*3/uL (ref 150.0–400.0)
RBC: 4.54 Mil/uL (ref 3.87–5.11)
RDW: 15.2 % (ref 11.5–15.5)
WBC: 6.8 10*3/uL (ref 4.0–10.5)

## 2022-02-12 LAB — COMPREHENSIVE METABOLIC PANEL
ALT: 8 U/L (ref 0–35)
AST: 19 U/L (ref 0–37)
Albumin: 4 g/dL (ref 3.5–5.2)
Alkaline Phosphatase: 66 U/L (ref 39–117)
BUN: 11 mg/dL (ref 6–23)
CO2: 24 mEq/L (ref 19–32)
Calcium: 8.9 mg/dL (ref 8.4–10.5)
Chloride: 103 mEq/L (ref 96–112)
Creatinine, Ser: 0.64 mg/dL (ref 0.40–1.20)
GFR: 105.84 mL/min (ref >60.00)
Glucose, Bld: 106 mg/dL — ABNORMAL HIGH (ref 70–99)
Potassium: 3.9 mEq/L (ref 3.5–5.1)
Sodium: 136 mEq/L (ref 135–145)
Total Bilirubin: 0.3 mg/dL (ref 0.2–1.2)
Total Protein: 7.4 g/dL (ref 6.0–8.3)

## 2022-02-12 LAB — LIPID PANEL
Cholesterol: 255 mg/dL — ABNORMAL HIGH (ref 0–200)
HDL: 92.1 mg/dL (ref >39.00)
LDL Cholesterol: 152 mg/dL — ABNORMAL HIGH (ref 0–99)
NonHDL: 162.57
Total CHOL/HDL Ratio: 3
Triglycerides: 54 mg/dL (ref 0.0–149.0)
VLDL: 10.8 mg/dL (ref 0.0–40.0)

## 2022-02-12 LAB — PROTIME-INR
INR: 1 ratio (ref 0.8–1.0)
Prothrombin Time: 11.4 s (ref 9.6–13.1)

## 2022-02-12 LAB — APTT: aPTT: 35.4 s (ref 25.4–36.8)

## 2022-02-12 LAB — HEMOGLOBIN A1C: Hgb A1c MFr Bld: 5.8 % (ref 4.6–6.5)

## 2022-02-12 LAB — TSH: TSH: 3 u[IU]/mL (ref 0.35–5.50)

## 2022-02-12 NOTE — Progress Notes (Signed)
   Subjective:   Patient ID: Yvette Campos, female    DOB: 29-Mar-1975, 47 y.o.   MRN: [id number]  HPI The patient is a 47 YO female coming in new for pre-op and physical.  PMH, [place], social history reviewed and updated  Review of Systems  Constitutional: Negative.   HENT: Negative.    Eyes: Negative.   Respiratory:  Negative for cough, chest tightness and shortness of breath.   Cardiovascular:  Negative for chest pain, palpitations and leg swelling.  Gastrointestinal:  Negative for abdominal distention, abdominal pain, constipation, diarrhea, nausea and vomiting.  Musculoskeletal: Negative.   Skin: Negative.   Neurological: Negative.   Psychiatric/Behavioral: Negative.      Objective:  Physical Exam Constitutional:      Appearance: She is well-developed.  HENT:     Head: Normocephalic and atraumatic.  Cardiovascular:     Rate and Rhythm: Normal rate and regular rhythm.  Pulmonary:     Effort: Pulmonary effort is normal. No respiratory distress.     Breath sounds: Normal breath sounds. No wheezing or rales.  Abdominal:     General: Bowel sounds are normal. There is no distension.     Palpations: Abdomen is soft.     Tenderness: There is no abdominal tenderness. There is no rebound.  Musculoskeletal:     Cervical back: Normal range of motion.  Skin:    General: Skin is warm and dry.  Neurological:     Mental Status: She is alert and oriented to person, place, and time.     Coordination: Coordination normal.     Vitals:   02/12/22 0805 02/12/22 0813  BP: (!) 140/100 (!) 140/100  Pulse: 90   Temp: 98 F (36.7 C)   TempSrc: Oral   SpO2: 94%   Weight: 226 lb (102.5 kg)   Height: 5\' 5"  (1.651 m)    EKG: Rate 77, axis normal, interval normal, sinus, no st or t wave changes, no prior to compare   Assessment & Plan:

## 2022-02-12 NOTE — Assessment & Plan Note (Signed)
Checking labs per pre-op testing for upcoming procedure next month. Overall low risk and EKG done today which is normal.

## 2022-02-12 NOTE — Assessment & Plan Note (Signed)
Flu shot declines. Covid-19 counseled. Tetanus not sure last date declines. Cologuard ordered. Mammogram up to date, pap smear up to date. Counseled about sun safety and mole surveillance. Counseled about the dangers of distracted driving. Given 10 year screening recommendations.

## 2022-02-12 NOTE — Assessment & Plan Note (Signed)
Prior reading at ob/gyn normal. Asked her to monitor BP and EKG done today which is normal. Report findings and follow up 3-6 months for recheck. Counseled about exercise and dash diet.

## 2022-02-12 NOTE — Patient Instructions (Addendum)
Your EKG is normal. We will do the labs and chest x-ray.   The colon test will come to you.

## 2022-02-13 ENCOUNTER — Ambulatory Visit (INDEPENDENT_AMBULATORY_CARE_PROVIDER_SITE_OTHER): Payer: Medicaid Other

## 2022-02-13 DIAGNOSIS — Z01818 Encounter for other preprocedural examination: Secondary | ICD-10-CM

## 2022-02-13 LAB — URINALYSIS, ROUTINE W REFLEX MICROSCOPIC
Bilirubin Urine: NEGATIVE
Ketones, ur: NEGATIVE
Leukocytes,Ua: NEGATIVE
Nitrite: NEGATIVE
Specific Gravity, Urine: >=1.03 — AB (ref 1.000–1.030)
Total Protein, Urine: NEGATIVE
Urine Glucose: NEGATIVE
Urobilinogen, UA: 0.2 (ref 0.0–1.0)
pH: 6 (ref 5.0–8.0)

## 2022-02-13 LAB — HIV ANTIBODY (ROUTINE TESTING W REFLEX): HIV 1&2 Ab, 4th Generation: NONREACTIVE

## 2022-02-13 LAB — PREGNANCY, URINE: Preg Test, Ur: NEGATIVE

## 2022-02-23 ENCOUNTER — Encounter: Payer: Self-pay | Admitting: Internal Medicine

## 2022-03-02 ENCOUNTER — Encounter: Payer: Self-pay | Admitting: Internal Medicine

## 2022-03-07 LAB — COLOGUARD: COLOGUARD: NEGATIVE

## 2022-03-08 ENCOUNTER — Encounter: Payer: Self-pay | Admitting: Internal Medicine

## 2022-03-08 LAB — COLOGUARD: Cologuard: NEGATIVE

## 2022-12-16 ENCOUNTER — Encounter: Payer: Commercial Managed Care - HMO | Admitting: Plastic Surgery

## 2022-12-19 ENCOUNTER — Other Ambulatory Visit: Payer: Self-pay | Admitting: Family Medicine

## 2022-12-19 DIAGNOSIS — Z0000 Encounter for general adult medical examination without abnormal findings: Secondary | ICD-10-CM

## 2022-12-24 ENCOUNTER — Ambulatory Visit: Payer: BLUE CROSS/BLUE SHIELD

## 2022-12-25 ENCOUNTER — Other Ambulatory Visit: Payer: Self-pay

## 2022-12-25 ENCOUNTER — Inpatient Hospital Stay (HOSPITAL_COMMUNITY)
Admission: EM | Admit: 2022-12-25 | Discharge: 2022-12-28 | DRG: 176 | Disposition: A | Discharge status: Home / Self Care | Payer: 59 | Source: Ambulatory Visit | Attending: Internal Medicine | Admitting: Internal Medicine

## 2022-12-25 ENCOUNTER — Encounter (HOSPITAL_COMMUNITY): Payer: Self-pay

## 2022-12-25 ENCOUNTER — Emergency Department (HOSPITAL_COMMUNITY): Payer: 59

## 2022-12-25 DIAGNOSIS — D509 Iron deficiency anemia, unspecified: Secondary | ICD-10-CM | POA: Diagnosis present

## 2022-12-25 DIAGNOSIS — Z8249 Family history of ischemic heart disease and other diseases of the circulatory system: Secondary | ICD-10-CM

## 2022-12-25 DIAGNOSIS — R7989 Other specified abnormal findings of blood chemistry: Secondary | ICD-10-CM | POA: Diagnosis present

## 2022-12-25 DIAGNOSIS — E66812 Obesity, class 2: Secondary | ICD-10-CM | POA: Diagnosis present

## 2022-12-25 DIAGNOSIS — Z87891 Personal history of nicotine dependence: Secondary | ICD-10-CM

## 2022-12-25 DIAGNOSIS — E669 Obesity, unspecified: Secondary | ICD-10-CM

## 2022-12-25 DIAGNOSIS — Z6839 Body mass index (BMI) 39.0-39.9, adult: Secondary | ICD-10-CM

## 2022-12-25 DIAGNOSIS — Z803 Family history of malignant neoplasm of breast: Secondary | ICD-10-CM

## 2022-12-25 DIAGNOSIS — Z7901 Long term (current) use of anticoagulants: Secondary | ICD-10-CM

## 2022-12-25 DIAGNOSIS — I2699 Other pulmonary embolism without acute cor pulmonale: Secondary | ICD-10-CM | POA: Diagnosis not present

## 2022-12-25 DIAGNOSIS — I517 Cardiomegaly: Secondary | ICD-10-CM | POA: Diagnosis present

## 2022-12-25 DIAGNOSIS — I82452 Acute embolism and thrombosis of left peroneal vein: Secondary | ICD-10-CM

## 2022-12-25 DIAGNOSIS — R0902 Hypoxemia: Secondary | ICD-10-CM | POA: Diagnosis not present

## 2022-12-25 DIAGNOSIS — I2694 Multiple subsegmental pulmonary emboli without acute cor pulmonale: Principal | ICD-10-CM | POA: Diagnosis present

## 2022-12-25 LAB — CBC WITH DIFFERENTIAL/PLATELET
Abs Immature Granulocytes: 0.02 10*3/uL (ref 0.00–0.07)
Basophils Absolute: 0.1 10*3/uL (ref 0.0–0.1)
Basophils Relative: 1 %
Eosinophils Absolute: 0.2 10*3/uL (ref 0.0–0.5)
Eosinophils Relative: 3 %
HCT: 36.2 % (ref 36.0–46.0)
Hemoglobin: 11.1 g/dL — ABNORMAL LOW (ref 12.0–15.0)
Immature Granulocytes: 0 %
Lymphocytes Relative: 31 %
Lymphs Abs: 2.4 10*3/uL (ref 0.7–4.0)
MCH: 24.3 pg — ABNORMAL LOW (ref 26.0–34.0)
MCHC: 30.7 g/dL (ref 30.0–36.0)
MCV: 79.4 fL — ABNORMAL LOW (ref 80.0–100.0)
Monocytes Absolute: 0.7 10*3/uL (ref 0.1–1.0)
Monocytes Relative: 9 %
Neutro Abs: 4.3 10*3/uL (ref 1.7–7.7)
Neutrophils Relative %: 56 %
Platelets: 231 10*3/uL (ref 150–400)
RBC: 4.56 MIL/uL (ref 3.87–5.11)
RDW: 19.7 % — ABNORMAL HIGH (ref 11.5–15.5)
WBC: 7.6 10*3/uL (ref 4.0–10.5)
nRBC: 0 % (ref 0.0–0.2)

## 2022-12-25 LAB — COMPREHENSIVE METABOLIC PANEL
ALT: 8 U/L (ref 0–44)
AST: 17 U/L (ref 15–41)
Albumin: 3.6 g/dL (ref 3.5–5.0)
Alkaline Phosphatase: 70 U/L (ref 38–126)
Anion gap: 11 (ref 5–15)
BUN: 11 mg/dL (ref 6–20)
CO2: 23 mmol/L (ref 22–32)
Calcium: 8.8 mg/dL — ABNORMAL LOW (ref 8.9–10.3)
Chloride: 102 mmol/L (ref 98–111)
Creatinine, Ser: 0.73 mg/dL (ref 0.44–1.00)
GFR, Estimated: >60 mL/min (ref >60)
Glucose, Bld: 100 mg/dL — ABNORMAL HIGH (ref 70–99)
Potassium: 3.8 mmol/L (ref 3.5–5.1)
Sodium: 136 mmol/L (ref 135–145)
Total Bilirubin: 0.7 mg/dL (ref <1.2)
Total Protein: 7.8 g/dL (ref 6.5–8.1)

## 2022-12-25 LAB — TROPONIN I (HIGH SENSITIVITY)
Troponin I (High Sensitivity): 51 ng/L — ABNORMAL HIGH (ref <18)
Troponin I (High Sensitivity): 42 ng/L — ABNORMAL HIGH (ref <18)

## 2022-12-25 LAB — D-DIMER, QUANTITATIVE: D-Dimer, Quant: 2.34 ug{FEU}/mL — ABNORMAL HIGH (ref 0.00–0.50)

## 2022-12-25 LAB — HCG, SERUM, QUALITATIVE: Preg, Serum: NEGATIVE

## 2022-12-25 MED ORDER — METHYLPREDNISOLONE SODIUM SUCC 125 MG IJ SOLR
125.0000 mg | Freq: Once | INTRAMUSCULAR | Status: AC
  Administered 2022-12-25: 125 mg via INTRAVENOUS
  Filled 2022-12-25: qty 2

## 2022-12-25 MED ORDER — ACETAMINOPHEN 325 MG PO TABS: 650.0000 mg | ORAL_TABLET | Freq: Four times a day (QID) | ORAL | Status: DC | PRN

## 2022-12-25 MED ORDER — IOHEXOL 350 MG/ML SOLN
75.0000 mL | Freq: Once | INTRAVENOUS | Status: AC | PRN
  Administered 2022-12-25: 75 mL via INTRAVENOUS

## 2022-12-25 MED ORDER — POLYETHYLENE GLYCOL 3350 17 G PO PACK: 17.0000 g | PACK | Freq: Every day | ORAL | Status: DC | PRN

## 2022-12-25 MED ORDER — SODIUM CHLORIDE 0.9% FLUSH
3.0000 mL | Freq: Two times a day (BID) | INTRAVENOUS | Status: DC
  Administered 2022-12-26 – 2022-12-28 (×6): 3 mL via INTRAVENOUS

## 2022-12-25 MED ORDER — ACETAMINOPHEN 650 MG RE SUPP: 650.0000 mg | Freq: Four times a day (QID) | RECTAL | Status: DC | PRN

## 2022-12-25 MED ORDER — ALPRAZOLAM 0.25 MG PO TABS
0.2500 mg | ORAL_TABLET | Freq: Every day | ORAL | Status: AC
  Administered 2022-12-26: 0.25 mg via ORAL
  Filled 2022-12-25: qty 1

## 2022-12-25 MED ORDER — ALBUTEROL SULFATE HFA 108 (90 BASE) MCG/ACT IN AERS
2.0000 | INHALATION_SPRAY | RESPIRATORY_TRACT | Status: DC | PRN
  Administered 2022-12-25: 2 via RESPIRATORY_TRACT
  Filled 2022-12-25: qty 6.7

## 2022-12-25 MED ORDER — HEPARIN (PORCINE) 25000 UT/250ML-% IV SOLN
1500.0000 [IU]/h | INTRAVENOUS | Status: DC
  Administered 2022-12-25 – 2022-12-27 (×3): 1500 [IU]/h via INTRAVENOUS
  Filled 2022-12-25 (×3): qty 250

## 2022-12-25 MED ORDER — HEPARIN BOLUS VIA INFUSION
2500.0000 [IU] | Freq: Once | INTRAVENOUS | Status: AC
  Administered 2022-12-25: 2500 [IU] via INTRAVENOUS
  Filled 2022-12-25: qty 2500

## 2022-12-25 NOTE — ED Triage Notes (Signed)
Patient came in for shortness of breath, patient states a few days ago she felt dizzy, sweaty, like she was going to faint-lasting about 5 minutes. Since then she has had increased shortness of breath with exertion. Patient states she gets out of breath when walking to the bathroom, able to speak short sentences, but gets winded with long sentences.Pt denies any significant medical history, denies taking blood thinners.

## 2022-12-25 NOTE — ED Notes (Signed)
Pt stated she would like to wait to take medication. Pt will like to eat first. Nurse gave pt sandwich and apple juice.

## 2022-12-25 NOTE — ED Provider Notes (Signed)
[place] EMERGENCY DEPARTMENT AT [facility] Provider Note   CSN: [id number] Arrival date & time: 12/25/22  1353     History  Chief Complaint  Patient presents with   Shortness of Breath    Yvette Campos is a 47 y.o. female here presenting with shortness of breath.  Patient states that for the last 2 to 3 days, she has been having some shortness of breath with deep breathing also with exertion.  Patient has no fever or chills but has nonproductive cough.  Patient went to urgent care was sent in for rule out ACS versus PE.  Patient denies any recent travel.  The history is provided by the patient.       Home Medications Prior to Admission medications   Not on File      Allergies    Patient has no known allergies.    Review of Systems   Review of Systems  Respiratory:  Positive for shortness of breath.   All other systems reviewed and are negative.   Physical Exam Updated Vital Signs BP (!) 145/111   Pulse 89   Temp 98.7 F (37.1 C) (Oral)   Resp 20   Ht 5\' 5"  (1.651 m)   Wt 107 kg   SpO2 100%   BMI 39.27 kg/m  Physical Exam Vitals and nursing note reviewed.  HENT:     Head: Normocephalic.     Mouth/Throat:     Mouth: Mucous membranes are moist.     Pharynx: Oropharynx is clear.  Eyes:     Extraocular Movements: Extraocular movements intact.     Pupils: Pupils are equal, round, and reactive to light.  Cardiovascular:     Rate and Rhythm: Normal rate and regular rhythm.  Pulmonary:     Comments: Diminished bilaterally but no wheezing Abdominal:     General: Bowel sounds are normal.     Palpations: Abdomen is soft.  Musculoskeletal:        General: Normal range of motion.     Cervical back: Normal range of motion and neck supple.  Skin:    General: Skin is warm.     Capillary Refill: Capillary refill takes less than 2 seconds.  Neurological:     General: No focal deficit present.     Mental Status: She is alert and oriented to  person, place, and time.  Psychiatric:        Mood and Affect: Mood normal.        Behavior: Behavior normal.     ED Results / Procedures / Treatments   Labs (all labs ordered are listed, but only abnormal results are displayed) Labs Reviewed  CBC WITH DIFFERENTIAL/PLATELET - Abnormal; Notable for the following components:      Result Value   Hemoglobin 11.1 (*)    MCV 79.4 (*)    MCH 24.3 (*)    RDW 19.7 (*)    All other components within normal limits  COMPREHENSIVE METABOLIC PANEL - Abnormal; Notable for the following components:   Glucose, Bld 100 (*)    Calcium 8.8 (*)    All other components within normal limits  D-DIMER, QUANTITATIVE - Abnormal; Notable for the following components:   D-Dimer, Quant 2.34 (*)    All other components within normal limits  TROPONIN I (HIGH SENSITIVITY) - Abnormal; Notable for the following components:   Troponin I (High Sensitivity) 51 (*)    All other components within normal limits  HCG, SERUM, QUALITATIVE  HEPARIN  LEVEL (UNFRACTIONATED)  CBC  TROPONIN I (HIGH SENSITIVITY)    EKG None  Radiology CT Angio Chest PE W and/or Wo Contrast  Addendum Date: 12/25/2022   ADDENDUM REPORT: 12/25/2022 18:12 ADDENDUM: These results were called by telephone at the time of interpretation on 12/25/2022 at 6:12 pm to provider Jonalyn Sedlak , who verbally acknowledged these results. Electronically Signed   By: Tish Frederickson M.D.   On: 12/25/2022 18:12   Result Date: 12/25/2022 CLINICAL DATA:  Pulmonary embolism (PE) suspected, high prob Patient came in for shortness of breath, patient states a few days ago she felt dizzy, sweaty, like she was going to faint-lasting about 5 minutes. Since then she has had increased shortness of breath with exertion EXAM: CT ANGIOGRAPHY CHEST WITH CONTRAST TECHNIQUE: Multidetector CT imaging of the chest was performed using the standard protocol during bolus administration of intravenous contrast. Multiplanar CT image  reconstructions and MIPs were obtained to evaluate the vascular anatomy. RADIATION DOSE REDUCTION: This exam was performed according to the departmental dose-optimization program which includes automated exposure control, adjustment of the mA and/or kV according to patient size and/or use of iterative reconstruction technique. CONTRAST:  75mL OMNIPAQUE IOHEXOL 350 MG/ML SOLN COMPARISON:  None Available. FINDINGS: Cardiovascular: Satisfactory opacification of the pulmonary arteries to the segmental level. All five lobes subsegmental and segmental pulmonary artery filling defects. The main pulmonary artery is normal in caliber. Straightening of the normal interventricular septum with RV to LV ratio of 1. Normal heart size. No significant pericardial effusion. The thoracic aorta is normal in caliber. No atherosclerotic plaque of the thoracic aorta. No coronary artery calcifications. Mediastinum/Nodes: No enlarged mediastinal, hilar, or axillary lymph nodes. Thyroid gland, trachea, and esophagus demonstrate no significant findings. Possible tiny hiatal hernia. Lungs/Pleura: No focal consolidation. No pulmonary nodule. No pulmonary mass. No pleural effusion. No pneumothorax. Upper Abdomen: No acute abnormality. Musculoskeletal: No chest wall abnormality. No suspicious lytic or blastic osseous lesions. No acute displaced fracture. Multilevel degenerative changes of the spine. Review of the MIP images confirms the above findings. IMPRESSION: All five lobes subsegmental and segmental pulmonary emboli. Possible developing right heart strain (RV LV ratio of 1). No associated pulmonary infarction. Electronically Signed: By: Tish Frederickson M.D. On: 12/25/2022 18:06   DG Chest 2 View  Result Date: 12/25/2022 CLINICAL DATA:  Shortness of breath. EXAM: CHEST - 2 VIEW COMPARISON:  February 13, 2022. FINDINGS: The heart size and mediastinal contours are within normal limits. Both lungs are clear. The visualized skeletal  structures are unremarkable. IMPRESSION: No active cardiopulmonary disease. Electronically Signed   By: Lupita Raider M.D.   On: 12/25/2022 16:42    Procedures Procedures    CRITICAL CARE Performed by: Richardean Canal   Total critical care time: 38 minutes  Critical care time was exclusive of separately billable procedures and treating other patients.  Critical care was necessary to treat or prevent imminent or life-threatening deterioration.  Critical care was time spent personally by me on the following activities: development of treatment plan with patient and/or surrogate as well as nursing, discussions with consultants, evaluation of patient's response to treatment, examination of patient, obtaining history from patient or surrogate, ordering and performing treatments and interventions, ordering and review of laboratory studies, ordering and review of radiographic studies, pulse oximetry and re-evaluation of patient's condition.   Medications Ordered in ED Medications  albuterol (VENTOLIN HFA) 108 (90 Base) MCG/ACT inhaler 2 puff (2 puffs Inhalation Given 12/25/22 1413)  methylPREDNISolone sodium succinate (SOLU-MEDROL)  125 mg/2 mL injection 125 mg (has no administration in time range)  heparin bolus via infusion 2,500 Units (has no administration in time range)  heparin ADULT infusion 100 units/mL (25000 units/269mL) (has no administration in time range)  iohexol (OMNIPAQUE) 350 MG/ML injection 75 mL (75 mLs Intravenous Contrast Given 12/25/22 1744)    ED Course/ Medical Decision Making/ A&P                                 Medical Decision Making Kaiyah Dimatteo is a 47 y.o. female here presenting with shortness of breath.  Patient has some shortness of breath with exertion.  I think likely she has bronchitis.  However I am concerned for ACS versus PE.  Plan to get CBC and BMP and troponin and D-dimer and chest x-ray.  Will give albuterol and Solu-Medrol as well.  6:29  PM Patient still is elevated at 2.3.  Troponin is elevated at 51.  CT showed PE with possible heart strain.  Patient is not hypotensive or hypoxic.  Discussed case with the critical care doctor, Dr. Katrinka Blazing.  He reviewed the case and states that patient does not need to come to the ICU or get IR thrombolytics.  He recommend admission to stepdown under the hospitalist service.  Problems Addressed: Multiple subsegmental pulmonary emboli without acute cor pulmonale (HCC): acute illness or injury  Amount and/or Complexity of Data Reviewed Labs: ordered. Decision-making details documented in ED Course. Radiology: ordered and independent interpretation performed. Decision-making details documented in ED Course. ECG/medicine tests: ordered and independent interpretation performed. Decision-making details documented in ED Course.  Risk Prescription drug management.    Final Clinical Impression(s) / ED Diagnoses Final diagnoses:  Multiple subsegmental pulmonary emboli without acute cor pulmonale [facility])    Rx / DC Orders ED Discharge Orders     None         Charlynne Pander, MD 12/25/22 [phone]

## 2022-12-25 NOTE — Progress Notes (Signed)
PHARMACY - ANTICOAGULATION CONSULT NOTE  Pharmacy Consult for IV heparin Indication: pulmonary embolus  No Known Allergies  Patient Measurements: Height: 5\' 5"  (165.1 cm) Weight: 107 kg (236 lb) IBW/kg (Calculated) : 57 Heparin Dosing Weight: 82 kg  Vital Signs: Temp: 98.7 F (37.1 C) (11/13 1402) Temp Source: Oral (11/13 1402) BP: 145/111 (11/13 1600) Pulse Rate: 89 (11/13 1600)  Labs: Recent Labs    12/25/22 1618  HGB 11.1*  HCT 36.2  PLT 231  CREATININE 0.73  TROPONINIHS 51*    Estimated Creatinine Clearance: 105.7 mL/min (by C-G formula based on SCr of 0.73 mg/dL).   Medical History: History reviewed. No pertinent past medical history.  Medications:  Scheduled:   methylPREDNISolone (SOLU-MEDROL) injection  125 mg Intravenous Once   Infusions:   Assessment: 47 yo with shortness of breath found to have new PE. Baseline labs already done.   Goal of Therapy:  Heparin level 0.3-0.7 units/ml Monitor platelets by anticoagulation protocol: Yes   Plan:  IV heparin 2500 unit bolus followed by  IV heparin rate of 1500 units/hr Check heparin level 6 hours after start of IV UFH Daily CBC  Berkley Harvey 12/25/2022,6:26 PM

## 2022-12-25 NOTE — Assessment & Plan Note (Signed)
Incidentally noted. Risk factor for PE. Patient previously had liposuction. Nutrition eval.

## 2022-12-25 NOTE — H&P (Signed)
History and Physical    Patient: Yvette Campos:[id number] DOB: [birth year] DOA: 12/25/2022 DOS: the patient was seen and examined on 12/25/2022 PCP: Myrlene Broker, MD  Patient coming from:  sent from urgent care facility.  Chief Complaint:  Chief Complaint  Patient presents with   Shortness of Breath   HPI: Yvette Campos is a 47 y.o. female with medical history significant of liposuction done earlier in the beginning of this year.  Following which patient has had a full recovery, otherwise patient does not report any past medical history.  Patient further denies any current smoking or hormonal replacement therapy.  Patient was in her usual state of health till approximately 4 days  ago when she noticed that she was having marked tachypnea with her usual levels of exertion such as going up stairs slowly.  There was associated sensation of presyncope/dizziness.  No chest pain no fever.  Patient did have a dry cough.  No leg swelling or cramping no nausea vomiting or diarrhea.  Due to persistent symptoms patient was evaluated at urgent care facility today and subsequently transferred to [facility] long ER.  Workup in the ER is positive for D-dimer with further finding of pulmonary embolism, see CAT scan finding below.  Patient is at rest on the stretcher today and claims that she is in "shock ".  Appears little bit tearful.  Patient had several questions about risk factors for pulmonary embolism these were answered. Review of Systems: As mentioned in the history of present illness. All other systems reviewed and are negative. History reviewed. No pertinent past medical history. History reviewed. No pertinent surgical history. Social History:  reports that she quit smoking about 25 years ago. Her smoking use included cigarettes. She has never used smokeless tobacco. She reports current alcohol use. She reports that she does not currently use drugs after having used the following drugs:  Marijuana.  No Known Allergies  Family History  Problem Relation Age of Onset   Breast cancer Mother 23   Cancer Mother    Hypertension Sister    Cancer Paternal Uncle    Diabetes Neg Hx     Prior to Admission medications   Not on File    Physical Exam: Vitals:   12/25/22 1404 12/25/22 1600 12/25/22 1855 12/25/22 1902  BP:  (!) 145/111 (!) 142/115   Pulse:  89 98   Resp:  20 12   Temp:    98.2 F (36.8 C)  TempSrc:    Oral  SpO2:  100% 100%   Weight: 107 kg     Height: 5\' 5"  (1.651 m)      General: Patient appeared slightly red diet, slightly watery hide.  Claims to be in shock.  Reassured that most people with pulmonary embolism with patient risk group do excellent with the prescribed treatment.  Patient is alert awake oriented x 3.  Obese appearing Respiratory; bilateral intravesicular Cardiovascular exam S1-S2 normal Abdomen all quadrants are soft nontender Extremities warm without edema Data Reviewed:  Labs on Admission:  Results for orders placed or performed during the hospital encounter of 12/25/22 (from the past 24 hour(s))  CBC with Differential     Status: Abnormal   Collection Time: 12/25/22  4:18 PM  Result Value Ref Range   WBC 7.6 4.0 - 10.5 K/uL   RBC 4.56 3.87 - 5.11 MIL/uL   Hemoglobin 11.1 (L) 12.0 - 15.0 g/dL   HCT 81.1 91.4 - 78.2 %   MCV 79.4 (L) 80.0 -  100.0 fL   MCH 24.3 (L) 26.0 - 34.0 pg   MCHC 30.7 30.0 - 36.0 g/dL   RDW 65.7 (H) 84.6 - 96.2 %   Platelets 231 150 - 400 K/uL   nRBC 0.0 0.0 - 0.2 %   Neutrophils Relative % 56 %   Neutro Abs 4.3 1.7 - 7.7 K/uL   Lymphocytes Relative 31 %   Lymphs Abs 2.4 0.7 - 4.0 K/uL   Monocytes Relative 9 %   Monocytes Absolute 0.7 0.1 - 1.0 K/uL   Eosinophils Relative 3 %   Eosinophils Absolute 0.2 0.0 - 0.5 K/uL   Basophils Relative 1 %   Basophils Absolute 0.1 0.0 - 0.1 K/uL   Immature Granulocytes 0 %   Abs Immature Granulocytes 0.02 0.00 - 0.07 K/uL  Comprehensive metabolic panel      Status: Abnormal   Collection Time: 12/25/22  4:18 PM  Result Value Ref Range   Sodium 136 135 - 145 mmol/L   Potassium 3.8 3.5 - 5.1 mmol/L   Chloride 102 98 - 111 mmol/L   CO2 23 22 - 32 mmol/L   Glucose, Bld 100 (H) 70 - 99 mg/dL   BUN 11 6 - 20 mg/dL   Creatinine, Ser 9.52 0.44 - 1.00 mg/dL   Calcium 8.8 (L) 8.9 - 10.3 mg/dL   Total Protein 7.8 6.5 - 8.1 g/dL   Albumin 3.6 3.5 - 5.0 g/dL   AST 17 15 - 41 U/L   ALT 8 0 - 44 U/L   Alkaline Phosphatase 70 38 - 126 U/L   Total Bilirubin 0.7 <1.2 mg/dL   GFR, Estimated >84 >13 mL/min   Anion gap 11 5 - 15  Troponin I (High Sensitivity)     Status: Abnormal   Collection Time: 12/25/22  4:18 PM  Result Value Ref Range   Troponin I (High Sensitivity) 51 (H) <18 ng/L  D-dimer, quantitative     Status: Abnormal   Collection Time: 12/25/22  4:18 PM  Result Value Ref Range   D-Dimer, Quant 2.34 (H) 0.00 - 0.50 ug/mL-FEU  hCG, serum, qualitative     Status: None   Collection Time: 12/25/22  4:18 PM  Result Value Ref Range   Preg, Serum NEGATIVE NEGATIVE   Basic Metabolic Panel: Recent Labs  Lab 12/25/22 1618  NA 136  K 3.8  CL 102  CO2 23  GLUCOSE 100*  BUN 11  CREATININE 0.73  CALCIUM 8.8*   Liver Function Tests: Recent Labs  Lab 12/25/22 1618  AST 17  ALT 8  ALKPHOS 70  BILITOT 0.7  PROT 7.8  ALBUMIN 3.6   No results for input(s): "LIPASE", "AMYLASE" in the last 168 hours. No results for input(s): "AMMONIA" in the last 168 hours. CBC: Recent Labs  Lab 12/25/22 1618  WBC 7.6  NEUTROABS 4.3  HGB 11.1*  HCT 36.2  MCV 79.4*  PLT 231   Cardiac Enzymes: Recent Labs  Lab 12/25/22 1618  TROPONINIHS 51*    BNP (last 3 results) No results for input(s): "PROBNP" in the last 8760 hours. CBG: No results for input(s): "GLUCAP" in the last 168 hours.  Radiological Exams on Admission:  CT Angio Chest PE W and/or Wo Contrast  Addendum Date: 12/25/2022   ADDENDUM REPORT: 12/25/2022 18:12 ADDENDUM: These  results were called by telephone at the time of interpretation on 12/25/2022 at 6:12 pm to provider DAVID YAO , who verbally acknowledged these results. Electronically Signed   By: Tish Frederickson  M.D.   On: 12/25/2022 18:12   Result Date: 12/25/2022 CLINICAL DATA:  Pulmonary embolism (PE) suspected, high prob Patient came in for shortness of breath, patient states a few days ago she felt dizzy, sweaty, like she was going to faint-lasting about 5 minutes. Since then she has had increased shortness of breath with exertion EXAM: CT ANGIOGRAPHY CHEST WITH CONTRAST TECHNIQUE: Multidetector CT imaging of the chest was performed using the standard protocol during bolus administration of intravenous contrast. Multiplanar CT image reconstructions and MIPs were obtained to evaluate the vascular anatomy. RADIATION DOSE REDUCTION: This exam was performed according to the departmental dose-optimization program which includes automated exposure control, adjustment of the mA and/or kV according to patient size and/or use of iterative reconstruction technique. CONTRAST:  75mL OMNIPAQUE IOHEXOL 350 MG/ML SOLN COMPARISON:  None Available. FINDINGS: Cardiovascular: Satisfactory opacification of the pulmonary arteries to the segmental level. All five lobes subsegmental and segmental pulmonary artery filling defects. The main pulmonary artery is normal in caliber. Straightening of the normal interventricular septum with RV to LV ratio of 1. Normal heart size. No significant pericardial effusion. The thoracic aorta is normal in caliber. No atherosclerotic plaque of the thoracic aorta. No coronary artery calcifications. Mediastinum/Nodes: No enlarged mediastinal, hilar, or axillary lymph nodes. Thyroid gland, trachea, and esophagus demonstrate no significant findings. Possible tiny hiatal hernia. Lungs/Pleura: No focal consolidation. No pulmonary nodule. No pulmonary mass. No pleural effusion. No pneumothorax. Upper Abdomen: No acute  abnormality. Musculoskeletal: No chest wall abnormality. No suspicious lytic or blastic osseous lesions. No acute displaced fracture. Multilevel degenerative changes of the spine. Review of the MIP images confirms the above findings. IMPRESSION: All five lobes subsegmental and segmental pulmonary emboli. Possible developing right heart strain (RV LV ratio of 1). No associated pulmonary infarction. Electronically Signed: By: Tish Frederickson M.D. On: 12/25/2022 18:06   DG Chest 2 View  Result Date: 12/25/2022 CLINICAL DATA:  Shortness of breath. EXAM: CHEST - 2 VIEW COMPARISON:  February 13, 2022. FINDINGS: The heart size and mediastinal contours are within normal limits. Both lungs are clear. The visualized skeletal structures are unremarkable. IMPRESSION: No active cardiopulmonary disease. Electronically Signed   By: Lupita Raider M.D.   On: 12/25/2022 16:42       Assessment and Plan: Acute pulmonary embolism (HCC) Acute pulmonary embolism affecting all 5 lobes with subsegmental and segmental pulmonary embolism.  We will check lower extremity Doppler to assess clot burden.  Fortunately patient is not having any hypotension or hypoxemia.  However she does have slight troponin elevation and a CAT scan itself is suggestive of RV to LV ratio of 1.  Case was discussed with pulmonary critical care services by ER attending.  Patient not candidate for thrombolytic therapy.  Patient is very concerned to ascertain why she had pulmonary embolism.  I advised that patients have both genetic and acquired risk factors.  At this time patient does not seem to have any obvious clinical risk factors behind obesity which is rather weak risk factor.  Then after discharge to ascertain patient's genotypes for hematologic abnormalities.  Discussed with patient to report any new symptoms to staff immediately.  Especially if concern would be any new headache or black liquid stool which may be concerning for bleeding.  However  any new pain should be reported or any new lightheadedness or chest pain.  Patient agreeable.  Patient is on her day 3 of menstrual cycle.  Patient typically has most heavy bleeding on day 2.  We will monitor this clinically.  PESI class I. C/w heparin started in ER Will attempt to check baseline inr/ptt when able.   Advance Care Planning:   Code Status: Not on file full code.  Consults: none at this time.  Family Communication: Offered to call patient's emergency contact, however patient declined same.  Patient cell phone is currently discharged.  She does not know the phone number for her family that she would like to contact.  Severity of Illness: The appropriate patient status for this patient is OBSERVATION. Observation status is judged to be reasonable and necessary in order to provide the required intensity of service to ensure the patient's safety. The patient's presenting symptoms, physical exam findings, and initial radiographic and laboratory data in the context of their medical condition is felt to place them at decreased risk for further clinical deterioration. Furthermore, it is anticipated that the patient will be medically stable for discharge from the hospital within 2 midnights of admission.   Author: Nolberto Hanlon, MD 12/25/2022 7:35 PM  For on call review [email or web address].

## 2022-12-25 NOTE — Assessment & Plan Note (Signed)
Continue heparin infusion  Supplemental oxygen for bouts of hypoxia  Considering right heart strain and extensive clot burden, monitoring is in stepdown unit for the first 24 hours  Await echocardiogram  Will likely transition to oral Eliquis therapy in the next 24 to 48 hours.   Patient will need to see hematology in the outpatient setting for eventual hypercoagulable workup so that testing can be timed around discontinuation of anticoagulation. Patient seems to have injured her left leg approximately 1 month ago in the same extremity that has a concurrent DVT.  Unclear as to whether this could have provoked a cascade of coagulation.

## 2022-12-26 ENCOUNTER — Observation Stay (HOSPITAL_COMMUNITY): Payer: 59

## 2022-12-26 DIAGNOSIS — R0902 Hypoxemia: Secondary | ICD-10-CM | POA: Diagnosis not present

## 2022-12-26 DIAGNOSIS — D509 Iron deficiency anemia, unspecified: Secondary | ICD-10-CM | POA: Diagnosis present

## 2022-12-26 DIAGNOSIS — M7989 Other specified soft tissue disorders: Secondary | ICD-10-CM

## 2022-12-26 DIAGNOSIS — I2699 Other pulmonary embolism without acute cor pulmonale: Secondary | ICD-10-CM | POA: Diagnosis present

## 2022-12-26 DIAGNOSIS — I2694 Multiple subsegmental pulmonary emboli without acute cor pulmonale: Secondary | ICD-10-CM | POA: Diagnosis present

## 2022-12-26 DIAGNOSIS — Z6839 Body mass index (BMI) 39.0-39.9, adult: Secondary | ICD-10-CM | POA: Diagnosis not present

## 2022-12-26 DIAGNOSIS — I82452 Acute embolism and thrombosis of left peroneal vein: Secondary | ICD-10-CM

## 2022-12-26 DIAGNOSIS — Z87891 Personal history of nicotine dependence: Secondary | ICD-10-CM | POA: Diagnosis not present

## 2022-12-26 DIAGNOSIS — Z803 Family history of malignant neoplasm of breast: Secondary | ICD-10-CM | POA: Diagnosis not present

## 2022-12-26 DIAGNOSIS — R7989 Other specified abnormal findings of blood chemistry: Secondary | ICD-10-CM | POA: Diagnosis present

## 2022-12-26 DIAGNOSIS — I2693 Single subsegmental pulmonary embolism without acute cor pulmonale: Secondary | ICD-10-CM | POA: Diagnosis not present

## 2022-12-26 DIAGNOSIS — I517 Cardiomegaly: Secondary | ICD-10-CM | POA: Diagnosis present

## 2022-12-26 DIAGNOSIS — E66812 Obesity, class 2: Secondary | ICD-10-CM

## 2022-12-26 DIAGNOSIS — Z8249 Family history of ischemic heart disease and other diseases of the circulatory system: Secondary | ICD-10-CM | POA: Diagnosis not present

## 2022-12-26 DIAGNOSIS — Z7901 Long term (current) use of anticoagulants: Secondary | ICD-10-CM | POA: Diagnosis not present

## 2022-12-26 LAB — APTT
aPTT: 89 s — ABNORMAL HIGH (ref 24–36)
aPTT: 59 s — ABNORMAL HIGH (ref 24–36)

## 2022-12-26 LAB — CBC
HCT: 36.4 % (ref 36.0–46.0)
Hemoglobin: 11.1 g/dL — ABNORMAL LOW (ref 12.0–15.0)
MCH: 24.1 pg — ABNORMAL LOW (ref 26.0–34.0)
MCHC: 30.5 g/dL (ref 30.0–36.0)
MCV: 79 fL — ABNORMAL LOW (ref 80.0–100.0)
Platelets: 248 10*3/uL (ref 150–400)
RBC: 4.61 MIL/uL (ref 3.87–5.11)
RDW: 20.3 % — ABNORMAL HIGH (ref 11.5–15.5)
WBC: 9.7 10*3/uL (ref 4.0–10.5)
nRBC: 0 % (ref 0.0–0.2)

## 2022-12-26 LAB — PROTIME-INR
INR: 1 (ref 0.8–1.2)
INR: 1 (ref 0.8–1.2)
Prothrombin Time: 13.9 s (ref 11.4–15.2)
Prothrombin Time: 13.7 s (ref 11.4–15.2)

## 2022-12-26 LAB — TROPONIN I (HIGH SENSITIVITY): Troponin I (High Sensitivity): 23 ng/L — ABNORMAL HIGH (ref <18)

## 2022-12-26 LAB — BASIC METABOLIC PANEL
Anion gap: 9 (ref 5–15)
BUN: 15 mg/dL (ref 6–20)
CO2: 23 mmol/L (ref 22–32)
Calcium: 9 mg/dL (ref 8.9–10.3)
Chloride: 101 mmol/L (ref 98–111)
Creatinine, Ser: 0.82 mg/dL (ref 0.44–1.00)
GFR, Estimated: >60 mL/min (ref >60)
Glucose, Bld: 190 mg/dL — ABNORMAL HIGH (ref 70–99)
Potassium: 4 mmol/L (ref 3.5–5.1)
Sodium: 133 mmol/L — ABNORMAL LOW (ref 135–145)

## 2022-12-26 LAB — HEPARIN LEVEL (UNFRACTIONATED)
Heparin Unfractionated: 0.41 [IU]/mL (ref 0.30–0.70)
Heparin Unfractionated: 0.4 [IU]/mL (ref 0.30–0.70)

## 2022-12-26 MED ORDER — CHLORHEXIDINE GLUCONATE CLOTH 2 % EX PADS
6.0000 | MEDICATED_PAD | Freq: Every day | CUTANEOUS | Status: DC
  Administered 2022-12-26 – 2022-12-27 (×2): 6 via TOPICAL

## 2022-12-26 MED ORDER — MELATONIN 5 MG PO TABS
10.0000 mg | ORAL_TABLET | Freq: Every evening | ORAL | Status: DC | PRN
  Administered 2022-12-26 – 2022-12-27 (×2): 10 mg via ORAL
  Filled 2022-12-26 (×2): qty 2

## 2022-12-26 NOTE — Assessment & Plan Note (Signed)
Once patient is clinically improved we will counsel on caloric restriction and regular physical activity

## 2022-12-26 NOTE — Progress Notes (Signed)
Bilateral lower extremity venous duplex has been completed. Preliminary results can be found in CV Proc through chart review.  Results were given to Dr. Leafy Half.  12/26/22 8:40 AM Olen Cordial RVT

## 2022-12-26 NOTE — Assessment & Plan Note (Signed)
Patient is chest pain-free No dynamic ST segment change on EKG Mild troponin elevation likely secondary to right heart strain.  Troponin elevation is expected to be self-limited and to downtrend with management of underlying pulmonary embolism.

## 2022-12-26 NOTE — Hospital Course (Addendum)
47 y.o. female with no significant past medical history presenting to [facility] emergency department with a 4-day history of increasing shortness of breath and lightheadedness as well as exertional dyspnea.   Upon evaluation in the emergency department patient was found to have diffuse subsegmental and segmental pulmonary emboli with evidence of right heart strain.  Patient was initiated on heparin infusion and the hospitalist group was then called to assess the patient for admission to the hospital.    Case was discussed with pulmonology.  Case and images were reviewed and it was felt the patient was not a candidate for thrombectomy or thrombolysis.  Patient was monitored closely in the stepdown unit and slowly clinically improved in the following 48 hours.  Patient was monitored on telemetry without any evidence of arrhythmic event.  Echocardiogram was obtained and while that revealed left ventricular hypertrophy revealed no evidence confirming right heart strain.  Of note, patient was additionally found to have DVT of the right peroneal veins identified on bilateral lower extremity ultrasound.  Clinically, it was felt that the plan for anticoagulation would be adequate managing this in addition to the pulmonary emboli.  With improving chest pain and shortness of breath patient was eventually transition to oral Eliquis for continued anticoagulation.  Patient was monitored while hospitalized for an additional 24 hours without events.  Patient was discharged home in improved and stable condition on 12/28/2022.  At time of discharge, patient was instructed to follow up closely with her primary care provider and to inquire about outpatient hematology evaluation for consideration of hypercoagulable workup at some point in the near future.

## 2022-12-26 NOTE — Progress Notes (Signed)
PROGRESS NOTE   Yvette Campos  UVO:[id number] DOB: [birth year] DOA: 12/25/2022 PCP: Myrlene Broker, MD   Date of Service: the patient was seen and examined on 12/26/2022  Brief Narrative:  47 y.o. female with no significant past medical history presenting to [facility] emergency department with a 4-day history of increasing shortness of breath and lightheadedness as well as exertional dyspnea.   Upon evaluation in the emergency department patient was found to have diffuse subsegmental and segmental pulmonary emboli with evidence of right heart strain.  Patient was initiated on heparin infusion and the hospitalist group was then called to assess the patient for admission to the hospital.    Case was discussed with pulmonology.  Case and images were reviewed and it was felt the patient was not a candidate for thrombectomy or thrombolysis.   Assessment & Plan Acute pulmonary embolism (HCC) Continue heparin infusion  Supplemental oxygen for bouts of hypoxia  Considering right heart strain and extensive clot burden, monitoring is in stepdown unit for the first 24 hours  Await echocardiogram  Will likely transition to oral Eliquis therapy in the next 24 to 48 hours.   Patient will need to see hematology in the outpatient setting for eventual hypercoagulable workup so that testing can be timed around discontinuation of anticoagulation. Patient seems to have injured her left leg approximately 1 month ago in the same extremity that has a concurrent DVT.  Unclear as to whether this could have provoked a cascade of coagulation.    Acute deep vein thrombosis (DVT) of left peroneal vein (HCC) Patient reports a 1 month history of left lower extremity swelling prior to presenting with her current symptoms.   See assessment and plan above Elevated troponin level not due myocardial infarction Patient is chest pain-free No dynamic ST segment change on EKG Mild troponin elevation likely  secondary to right heart strain.  Troponin elevation is expected to be self-limited and to downtrend with management of underlying pulmonary embolism. Class 2 severe obesity due to excess calories with serious comorbidity and body mass index (BMI) of 39.0 to 39.9 in adult [facility]) Once patient is clinically improved we will counsel on caloric restriction and regular physical activity.     Subjective:  Patient continues to complain of shortness of breath, mild to moderate intensity, improving compared to yesterday, associated with occasional dry cough.  Patient denies any associated chest pain.  Patient states that her left lower extremity swelling seems to be improving.  Physical Exam:  Vitals:   12/26/22 0815 12/26/22 0830 12/26/22 0845 12/26/22 0900  BP:    (!) 136/104  Pulse: 99 95 98 95  Resp: 18 (!) 22 18 18   Temp:      TempSrc:      SpO2: 100% 98% 99% 99%  Weight:      Height:        Constitutional: Awake alert and oriented x3, no associated distress.   Skin: no rashes, no lesions, good skin turgor noted. Eyes: Pupils are equally reactive to light.  No evidence of scleral icterus or conjunctival pallor.  ENMT: Moist mucous membranes noted.  Posterior pharynx clear of any exudate or lesions.   Respiratory: clear to auscultation bilaterally, no wheezing, no crackles. Normal respiratory effort. No accessory muscle use.  Cardiovascular: Regular rate and rhythm, no murmurs / rubs / gallops. No extremity edema. 2+ pedal pulses. No carotid bruits.  Abdomen: Abdomen is soft and nontender.  No evidence of intra-abdominal masses.  Positive bowel  sounds noted in all quadrants.   Musculoskeletal: No joint deformity upper and lower extremities. Good ROM, no contractures. Normal muscle tone.    Data Reviewed:  I have personally reviewed and interpreted labs, imaging.  Significant findings are   CBC: Recent Labs  Lab 12/25/22 1618 12/26/22 0440  WBC 7.6 9.7  NEUTROABS 4.3  --   HGB  11.1* 11.1*  HCT 36.2 36.4  MCV 79.4* 79.0*  PLT 231 248   Basic Metabolic Panel: Recent Labs  Lab 12/25/22 1618 12/26/22 0440  NA 136 133*  K 3.8 4.0  CL 102 101  CO2 23 23  GLUCOSE 100* 190*  BUN 11 15  CREATININE 0.73 0.82  CALCIUM 8.8* 9.0   GFR: Estimated Creatinine Clearance: 103.1 mL/min (by C-G formula based on SCr of 0.82 mg/dL). Liver Function Tests: Recent Labs  Lab 12/25/22 1618  AST 17  ALT 8  ALKPHOS 70  BILITOT 0.7  PROT 7.8  ALBUMIN 3.6    Coagulation Profile: Recent Labs  Lab 12/26/22 0440  INR 1.0     Telemetry: Personally reviewed.  Rhythm is sinus tachycardia with heart rate of 102 bpm.     Code Status:  Full code.  Code status decision has been confirmed with: patient  Severity of Illness:  The appropriate patient status for this patient is INPATIENT. Inpatient status is judged to be reasonable and necessary in order to provide the required intensity of service to ensure the patient's safety. The patient's presenting symptoms, physical exam findings, and initial radiographic and laboratory data in the context of their chronic comorbidities is felt to place them at high risk for further clinical deterioration. Furthermore, it is not anticipated that the patient will be medically stable for discharge from the hospital within 2 midnights of admission.   * I certify that at the point of admission it is my clinical judgment that the patient will require inpatient hospital care spanning beyond 2 midnights from the point of admission due to high intensity of service, high risk for further deterioration and high frequency of surveillance required.*  Time spent:  61 minutes  Author:  Marinda Elk MD  12/26/2022 10:10 AM

## 2022-12-26 NOTE — Plan of Care (Signed)
  Problem: Safety: Goal: Ability to remain free from injury will improve Outcome: Progressing   Problem: Skin Integrity: Goal: Risk for impaired skin integrity will decrease Outcome: Progressing   Problem: Clinical Measurements: Goal: Diagnostic test results will improve Outcome: Not Progressing Goal: Respiratory complications will improve Outcome: Not Progressing

## 2022-12-26 NOTE — Progress Notes (Signed)
PHARMACY - ANTICOAGULATION CONSULT NOTE  Pharmacy Consult for IV heparin Indication: pulmonary embolus  No Known Allergies  Patient Measurements: Height: 5\' 5"  (165.1 cm) Weight: 107 kg (236 lb) IBW/kg (Calculated) : 57 Heparin Dosing Weight: 82 kg  Vital Signs: Temp: 98.1 F (36.7 C) (11/14 0202) Temp Source: Oral (11/13 1902) BP: 124/85 (11/14 0230) Pulse Rate: 97 (11/14 0230)  Labs: Recent Labs    12/25/22 1618 12/25/22 2034 12/26/22 0215  HGB 11.1*  --   --   HCT 36.2  --   --   PLT 231  --   --   HEPARINUNFRC  --   --  0.41  CREATININE 0.73  --   --   TROPONINIHS 51* 42*  --     Estimated Creatinine Clearance: 105.7 mL/min (by C-G formula based on SCr of 0.73 mg/dL).   Medical History: History reviewed. No pertinent past medical history.  Medications:  Scheduled:   sodium chloride flush  3 mL Intravenous Q12H   Infusions:   heparin 1,500 Units/hr (12/25/22 1853)    Assessment: 47 yo with shortness of breath found to have new PE. Baseline labs already done.   HL 0.41 therapeutic on 1500 units/hr No bleeding reported  Goal of Therapy:  Heparin level 0.3-0.7 units/ml Monitor platelets by anticoagulation protocol: Yes   Plan:  Continue IV heparin rate of 1500 units/hr Confirmatory level in 6 hours Daily CBC  Arley Phenix RPh 12/26/2022, 2:39 AM

## 2022-12-26 NOTE — ED Notes (Addendum)
ED TO INPATIENT HANDOFF REPORT  ED Nurse Name and Phone #: Pennelope Bracken EMTP  S Name/Age/Gender Yvette Campos 47 y.o. female Room/Bed: WA03/WA03  Code Status   Code Status: Full Code  Home/SNF/Other Home Patient oriented to: self, place, time, and situation Is this baseline? Yes   Triage Complete: Triage complete  Chief Complaint Pulmonary embolism (HCC) [I26.99] Acute pulmonary embolism (HCC) [I26.99]  Triage Note Patient came in for shortness of breath, patient states a few days ago she felt dizzy, sweaty, like she was going to faint-lasting about 5 minutes. Since then she has had increased shortness of breath with exertion. Patient states she gets out of breath when walking to the bathroom, able to speak short sentences, but gets winded with long sentences.Pt denies any significant medical history, denies taking blood thinners.   Allergies No Known Allergies  Level of Care/Admitting Diagnosis ED Disposition     ED Disposition  Admit   Condition  --   Comment  Hospital Area: [facility] [[id number]]  Level of Care: Stepdown [14]  Admit to SDU based on following criteria: Respiratory Distress:  Frequent assessment and/or intervention to maintain adequate ventilation/respiration, pulmonary toilet, and respiratory treatment.  May admit patient to [facility] or [facility] if equivalent level of care is available:: Yes  Covid Evaluation: Asymptomatic - no recent exposure (last 10 days) testing not required  Diagnosis: Acute pulmonary embolism [facility]) [[id number]]  Admitting Physician: Marinda Elk [[id number]]  Attending Physician: Marinda Elk [[id number]]  Certification:: I certify this patient will need inpatient services for at least 2 midnights  Expected Medical Readiness: 12/28/2022          B Medical/Surgery History History reviewed. No pertinent past medical history. History reviewed. No pertinent surgical history.   A IV  Location/Drains/Wounds Patient Lines/Drains/Airways Status     Active Line/Drains/Airways     Name Placement date Placement time Site Days   Peripheral IV 12/25/22 20 G Anterior;Left;Upper Arm 12/25/22  1616  Arm  1            Intake/Output Last 24 hours  Intake/Output Summary (Last 24 hours) at 12/26/2022 1620 Last data filed at 12/26/2022 0206 Gross per 24 hour  Intake 3 ml  Output --  Net 3 ml    Labs/Imaging Results for orders placed or performed during the hospital encounter of 12/25/22 (from the past 48 hour(s))  CBC with Differential     Status: Abnormal   Collection Time: 12/25/22  4:18 PM  Result Value Ref Range   WBC 7.6 4.0 - 10.5 K/uL   RBC 4.56 3.87 - 5.11 MIL/uL   Hemoglobin 11.1 (L) 12.0 - 15.0 g/dL   HCT 40.3 47.4 - 25.9 %   MCV 79.4 (L) 80.0 - 100.0 fL   MCH 24.3 (L) 26.0 - 34.0 pg   MCHC 30.7 30.0 - 36.0 g/dL   RDW 56.3 (H) 87.5 - 64.3 %   Platelets 231 150 - 400 K/uL   nRBC 0.0 0.0 - 0.2 %   Neutrophils Relative % 56 %   Neutro Abs 4.3 1.7 - 7.7 K/uL   Lymphocytes Relative 31 %   Lymphs Abs 2.4 0.7 - 4.0 K/uL   Monocytes Relative 9 %   Monocytes Absolute 0.7 0.1 - 1.0 K/uL   Eosinophils Relative 3 %   Eosinophils Absolute 0.2 0.0 - 0.5 K/uL   Basophils Relative 1 %   Basophils Absolute 0.1 0.0 - 0.1 K/uL   Immature Granulocytes 0 %  Abs Immature Granulocytes 0.02 0.00 - 0.07 K/uL    Comment: Performed at [facility], [address], [place], [place] [address]  Comprehensive metabolic panel     Status: Abnormal   Collection Time: 12/25/22  4:18 PM  Result Value Ref Range   Sodium 136 135 - 145 mmol/L   Potassium 3.8 3.5 - 5.1 mmol/L   Chloride 102 98 - 111 mmol/L   CO2 23 22 - 32 mmol/L   Glucose, Bld 100 (H) 70 - 99 mg/dL    Comment: Glucose reference range applies only to samples taken after fasting for at least 8 hours.   BUN 11 6 - 20 mg/dL   Creatinine, Ser 6.04 0.44 - 1.00 mg/dL   Calcium 8.8 (L) 8.9 - 10.3  mg/dL   Total Protein 7.8 6.5 - 8.1 g/dL   Albumin 3.6 3.5 - 5.0 g/dL   AST 17 15 - 41 U/L   ALT 8 0 - 44 U/L   Alkaline Phosphatase 70 38 - 126 U/L   Total Bilirubin 0.7 <1.2 mg/dL   GFR, Estimated >54 >09 mL/min    Comment: (NOTE) Calculated using the CKD-EPI Creatinine Equation (2021)    Anion gap 11 5 - 15    Comment: Performed at [facility], [address], [place], [place] [address]  Troponin I (High Sensitivity)     Status: Abnormal   Collection Time: 12/25/22  4:18 PM  Result Value Ref Range   Troponin I (High Sensitivity) 51 (H) <18 ng/L    Comment: (NOTE) Elevated high sensitivity troponin I (hsTnI) values and significant  changes across serial measurements may suggest ACS but many other  chronic and acute conditions are known to elevate hsTnI results.  Refer to the "Links" section for chest pain algorithms and additional  guidance. Performed at [facility], [address] [place], [place] [address]   D-dimer, quantitative     Status: Abnormal   Collection Time: 12/25/22  4:18 PM  Result Value Ref Range   D-Dimer, Quant 2.34 (H) 0.00 - 0.50 ug/mL-FEU    Comment: (NOTE) At the manufacturer cut-off value of 0.5 g/mL FEU, this assay has a negative predictive value of 95-100%.This assay is intended for use in conjunction with a clinical pretest probability (PTP) assessment model to exclude pulmonary embolism (PE) and deep venous thrombosis (DVT) in outpatients suspected of PE or DVT. Results should be correlated with clinical presentation. Performed at [facility], [address] [place], [place] [address]   hCG, serum, qualitative     Status: None   Collection Time: 12/25/22  4:18 PM  Result Value Ref Range   Preg, Serum NEGATIVE NEGATIVE    Comment:        THE SENSITIVITY OF THIS METHODOLOGY IS >10 mIU/mL. Performed at [facility], [address], [place], [place] [address]    Troponin I (High Sensitivity)     Status: Abnormal   Collection Time: 12/25/22  8:34 PM  Result Value Ref Range   Troponin I (High Sensitivity) 42 (H) <18 ng/L    Comment: (NOTE) Elevated high sensitivity troponin I (hsTnI) values and significant  changes across serial measurements may suggest ACS but many other  chronic and acute conditions are known to elevate hsTnI results.  Refer to the "Links" section for chest pain algorithms and additional  guidance. Performed at [facility], [address], [place], [place] [address]   Heparin level (  unfractionated)     Status: None   Collection Time: 12/26/22  2:15 AM  Result Value Ref Range   Heparin Unfractionated 0.41 0.30 - 0.70 IU/mL    Comment: (NOTE) The clinical reportable range upper limit is being lowered to >1.10 to align with the FDA approved guidance for the current laboratory assay.  If heparin results are below expected values, and patient dosage has  been confirmed, suggest follow up testing of antithrombin III levels. Performed at [facility], [address], [place], [place] [address]   CBC     Status: Abnormal   Collection Time: 12/26/22  4:40 AM  Result Value Ref Range   WBC 9.7 4.0 - 10.5 K/uL   RBC 4.61 3.87 - 5.11 MIL/uL   Hemoglobin 11.1 (L) 12.0 - 15.0 g/dL   HCT 60.4 54.0 - 98.1 %   MCV 79.0 (L) 80.0 - 100.0 fL   MCH 24.1 (L) 26.0 - 34.0 pg   MCHC 30.5 30.0 - 36.0 g/dL   RDW 19.1 (H) 47.8 - 29.5 %   Platelets 248 150 - 400 K/uL   nRBC 0.0 0.0 - 0.2 %    Comment: Performed at [facility], [address], [place], [place] [address]  APTT     Status: Abnormal   Collection Time: 12/26/22  4:40 AM  Result Value Ref Range   aPTT 89 (H) 24 - 36 seconds    Comment:        IF BASELINE aPTT IS ELEVATED, SUGGEST PATIENT RISK ASSESSMENT BE USED TO DETERMINE APPROPRIATE ANTICOAGULANT THERAPY. Performed at [facility], [address] [place], [place] [address]   Protime-INR     Status: None   Collection Time: 12/26/22  4:40 AM  Result Value Ref Range   Prothrombin Time 13.9 11.4 - 15.2 seconds   INR 1.0 0.8 - 1.2    Comment: (NOTE) INR goal varies based on device and disease states. Performed at [facility], [address] [place], [place] [address]   Basic metabolic panel     Status: Abnormal   Collection Time: 12/26/22  4:40 AM  Result Value Ref Range   Sodium 133 (L) 135 - 145 mmol/L   Potassium 4.0 3.5 - 5.1 mmol/L   Chloride 101 98 - 111 mmol/L   CO2 23 22 - 32 mmol/L   Glucose, Bld 190 (H) 70 - 99 mg/dL    Comment: Glucose reference range applies only to samples taken after fasting for at least 8 hours.   BUN 15 6 - 20 mg/dL   Creatinine, Ser 9.52 0.44 - 1.00 mg/dL   Calcium 9.0 8.9 - 84.1 mg/dL   GFR, Estimated >32 >44 mL/min    Comment: (NOTE) Calculated using the CKD-EPI Creatinine Equation (2021)    Anion gap 9 5 - 15    Comment: Performed at [facility], [address], [place], [place] [address]  Troponin I (High Sensitivity)     Status: Abnormal   Collection Time: 12/26/22  4:40 AM  Result Value Ref Range   Troponin I (High Sensitivity) 23 (H) <18 ng/L    Comment: CRITICAL RESULT CALLED TO, READ BACK BY AND VERIFIED WITH [place], N RN @ [phone] ON 12/26/2022 BY MTA DELTA CHECK NOTED (NOTE) Elevated high sensitivity troponin I (hsTnI) values and significant  changes across serial measurements may suggest ACS but many other  chronic and acute conditions are known to elevate hsTnI results.  Refer to the "Links" section for chest pain algorithms and additional  guidance. Performed at [facility], [address] [place], [place] [address]   Heparin level (unfractionated)     Status: None   Collection Time: 12/26/22  9:45 AM  Result Value Ref Range   Heparin Unfractionated 0.40 0.30 - 0.70 IU/mL    Comment: (NOTE) The  clinical reportable range upper limit is being lowered to >1.10 to align with the FDA approved guidance for the current laboratory assay.  If heparin results are below expected values, and patient dosage has  been confirmed, suggest follow up testing of antithrombin III levels. Performed at [facility], [address], [place], [place] [address]    VAS [place] LOWER EXTREMITY VENOUS (DVT)  Result Date: 12/26/2022  Lower Venous DVT Study Patient Name:  Yvette Campos  Date of Exam:   12/26/2022 Medical Rec #: [id number]       Accession #:    [id number] Date of Birth: 16-Jul-1975       Patient Gender: F Patient Age:   12 years Exam Location:  [facility] Procedure:      VAS [place] LOWER EXTREMITY VENOUS (DVT) Referring Phys: [facility] GOEL --------------------------------------------------------------------------------  Indications: Swelling.  Risk Factors: Confirmed PE. Anticoagulation: Heparin. Limitations: Poor ultrasound/tissue interface. Comparison Study: No prior studies. Performing Technologist: Chanda Busing RVT  Examination Guidelines: A complete evaluation includes B-mode imaging, spectral Doppler, color Doppler, and power Doppler as needed of all accessible portions of each vessel. Bilateral testing is considered an integral part of a complete examination. Limited examinations for reoccurring indications may be performed as noted. The reflux portion of the exam is performed with the patient in reverse Trendelenburg.  +---------+---------------+---------+-----------+----------+--------------+ RIGHT    CompressibilityPhasicitySpontaneityPropertiesThrombus Aging +---------+---------------+---------+-----------+----------+--------------+ CFV      Full           Yes      Yes                                 +---------+---------------+---------+-----------+----------+--------------+ SFJ      Full                                                         +---------+---------------+---------+-----------+----------+--------------+ FV Prox  Full                                                        +---------+---------------+---------+-----------+----------+--------------+ FV Mid   Full                                                        +---------+---------------+---------+-----------+----------+--------------+ FV DistalFull                                                        +---------+---------------+---------+-----------+----------+--------------+  PFV      Full                                                        +---------+---------------+---------+-----------+----------+--------------+ POP      Full           Yes      Yes                                 +---------+---------------+---------+-----------+----------+--------------+ PTV      Full                                                        +---------+---------------+---------+-----------+----------+--------------+ PERO     Full                                                        +---------+---------------+---------+-----------+----------+--------------+   +---------+---------------+---------+-----------+----------+--------------+ LEFT     CompressibilityPhasicitySpontaneityPropertiesThrombus Aging +---------+---------------+---------+-----------+----------+--------------+ CFV      Full           Yes      Yes                                 +---------+---------------+---------+-----------+----------+--------------+ SFJ      Full                                                        +---------+---------------+---------+-----------+----------+--------------+ FV Prox  Full                                                        +---------+---------------+---------+-----------+----------+--------------+ FV Mid   Full                                                         +---------+---------------+---------+-----------+----------+--------------+ FV DistalFull                                                        +---------+---------------+---------+-----------+----------+--------------+ PFV      Full                                                        +---------+---------------+---------+-----------+----------+--------------+  POP      Full           Yes      Yes                                 +---------+---------------+---------+-----------+----------+--------------+ PTV      Full                                                        +---------+---------------+---------+-----------+----------+--------------+ PERO     Partial                                      Acute          +---------+---------------+---------+-----------+----------+--------------+     Summary: RIGHT: - There is no evidence of deep vein thrombosis in the lower extremity.  - No cystic structure found in the popliteal fossa.  LEFT: - Findings consistent with acute deep vein thrombosis involving the left peroneal veins.  - No cystic structure found in the popliteal fossa.  *See table(s) above for measurements and observations. Electronically signed by Sherald Hess MD on 12/26/2022 at 1:28:31 PM.    Final    CT Angio Chest PE W and/or Wo Contrast  Addendum Date: 12/25/2022   ADDENDUM REPORT: 12/25/2022 18:12 ADDENDUM: These results were called by telephone at the time of interpretation on 12/25/2022 at 6:12 pm to provider DAVID YAO , who verbally acknowledged these results. Electronically Signed   By: Tish Frederickson M.D.   On: 12/25/2022 18:12   Result Date: 12/25/2022 CLINICAL DATA:  Pulmonary embolism (PE) suspected, high prob Patient came in for shortness of breath, patient states a few days ago she felt dizzy, sweaty, like she was going to faint-lasting about 5 minutes. Since then she has had increased shortness of breath with exertion EXAM: CT ANGIOGRAPHY  CHEST WITH CONTRAST TECHNIQUE: Multidetector CT imaging of the chest was performed using the standard protocol during bolus administration of intravenous contrast. Multiplanar CT image reconstructions and MIPs were obtained to evaluate the vascular anatomy. RADIATION DOSE REDUCTION: This exam was performed according to the departmental dose-optimization program which includes automated exposure control, adjustment of the mA and/or kV according to patient size and/or use of iterative reconstruction technique. CONTRAST:  75mL OMNIPAQUE IOHEXOL 350 MG/ML SOLN COMPARISON:  None Available. FINDINGS: Cardiovascular: Satisfactory opacification of the pulmonary arteries to the segmental level. All five lobes subsegmental and segmental pulmonary artery filling defects. The main pulmonary artery is normal in caliber. Straightening of the normal interventricular septum with RV to LV ratio of 1. Normal heart size. No significant pericardial effusion. The thoracic aorta is normal in caliber. No atherosclerotic plaque of the thoracic aorta. No coronary artery calcifications. Mediastinum/Nodes: No enlarged mediastinal, hilar, or axillary lymph nodes. Thyroid gland, trachea, and esophagus demonstrate no significant findings. Possible tiny hiatal hernia. Lungs/Pleura: No focal consolidation. No pulmonary nodule. No pulmonary mass. No pleural effusion. No pneumothorax. Upper Abdomen: No acute abnormality. Musculoskeletal: No chest wall abnormality. No suspicious lytic or blastic osseous lesions. No acute displaced fracture. Multilevel degenerative changes of the spine. Review of the MIP images confirms the above findings. IMPRESSION: All five lobes subsegmental and segmental pulmonary emboli. Possible  developing right heart strain (RV LV ratio of 1). No associated pulmonary infarction. Electronically Signed: By: Tish Frederickson M.D. On: 12/25/2022 18:06   DG Chest 2 View  Result Date: 12/25/2022 CLINICAL DATA:  Shortness of  breath. EXAM: CHEST - 2 VIEW COMPARISON:  February 13, 2022. FINDINGS: The heart size and mediastinal contours are within normal limits. Both lungs are clear. The visualized skeletal structures are unremarkable. IMPRESSION: No active cardiopulmonary disease. Electronically Signed   By: Lupita Raider M.D.   On: 12/25/2022 16:42    Pending Labs Unresulted Labs (From admission, onward)     Start     Ordered   12/27/22 0500  Heparin level (unfractionated)  Daily,   R      12/26/22 1140   12/26/22 0500  CBC  Daily,   R      12/25/22 1830   12/25/22 1953  Protime-INR  Add-on,   AD        12/25/22 1952   12/25/22 1953  APTT  Add-on,   AD        12/25/22 1952            Vitals/Pain Today's Vitals   12/26/22 1019 12/26/22 1300 12/26/22 1400 12/26/22 1418  BP:   (!) 141/95   Pulse:  99 89   Resp:  19 (!) 22   Temp: 98.2 F (36.8 C)   98.3 F (36.8 C)  TempSrc: Oral   Oral  SpO2:  96% 97%   Weight:      Height:      PainSc:        Isolation Precautions No active isolations  Medications Medications  albuterol (VENTOLIN HFA) 108 (90 Base) MCG/ACT inhaler 2 puff (2 puffs Inhalation Given 12/25/22 1413)  heparin ADULT infusion 100 units/mL (25000 units/228mL) (1,500 Units/hr Intravenous New Bag/Given 12/26/22 0946)  acetaminophen (TYLENOL) tablet 650 mg (has no administration in time range)    Or  acetaminophen (TYLENOL) suppository 650 mg (has no administration in time range)  polyethylene glycol (MIRALAX / GLYCOLAX) packet 17 g (has no administration in time range)  sodium chloride flush (NS) 0.9 % injection 3 mL (3 mLs Intravenous Given 12/26/22 1030)  methylPREDNISolone sodium succinate (SOLU-MEDROL) 125 mg/2 mL injection 125 mg (125 mg Intravenous Given 12/25/22 1842)  iohexol (OMNIPAQUE) 350 MG/ML injection 75 mL (75 mLs Intravenous Contrast Given 12/25/22 1744)  heparin bolus via infusion 2,500 Units (2,500 Units Intravenous Bolus from Bag 12/25/22 1849)  ALPRAZolam  (XANAX) tablet 0.25 mg (0.25 mg Oral Given 12/26/22 0000)    Mobility walks     Focused Assessments See Chart   R Recommendations: See Admitting Provider Note  Report given to: Nilda Calamity RN

## 2022-12-26 NOTE — Progress Notes (Signed)
PHARMACY - ANTICOAGULATION CONSULT NOTE  Pharmacy Consult for IV heparin Indication: pulmonary embolus  No Known Allergies  Patient Measurements: Height: 5\' 5"  (165.1 cm) Weight: 107 kg (236 lb) IBW/kg (Calculated) : 57 Heparin Dosing Weight: 82 kg  Vital Signs: Temp: 98.2 F (36.8 C) (11/14 1019) Temp Source: Oral (11/14 1019) BP: 136/104 (11/14 0900) Pulse Rate: 95 (11/14 0900)  Labs: Recent Labs    12/25/22 1618 12/25/22 2034 12/26/22 0215 12/26/22 0440 12/26/22 0945  HGB 11.1*  --   --  11.1*  --   HCT 36.2  --   --  36.4  --   PLT 231  --   --  248  --   APTT  --   --   --  89*  --   LABPROT  --   --   --  13.9  --   INR  --   --   --  1.0  --   HEPARINUNFRC  --   --  0.41  --  0.40  CREATININE 0.73  --   --  0.82  --   TROPONINIHS 51* 42*  --  23*  --     Estimated Creatinine Clearance: 103.1 mL/min (by C-G formula based on SCr of 0.82 mg/dL).   Medical History: History reviewed. No pertinent past medical history.  Medications:  Scheduled:   sodium chloride flush  3 mL Intravenous Q12H   Infusions:   heparin 1,500 Units/hr (12/26/22 0946)    Assessment: 47 yo with shortness of breath found to have new PE. Baseline labs already done.   Imaging: 11/13 CT Angio Chest: diffuse subsegmental and segmental PE, possible developing right heart strain 11/14 VAS [place] lower extremity: left DVT  Today, 12/26/22 09:45 confirmatory HL 0.40, therapeutic, on heparin infusing at  1500 units/hr No bleeding reported  Goal of Therapy:  Heparin level 0.3-0.7 units/ml Monitor platelets by anticoagulation protocol: Yes   Plan:  Continue IV heparin infusion at 1500 units/hr Monitor daily heparin level, CBC, signs/symptoms of bleeding F/U long term oral anticoagulation plan   Thank you for allowing pharmacy to be a part of this patient's care.  Selinda Eon, PharmD, BCPS Clinical Pharmacist [place] 12/26/2022 11:39 AM

## 2022-12-26 NOTE — Assessment & Plan Note (Signed)
Patient reports a 1 month history of left lower extremity swelling prior to presenting with her current symptoms.   See assessment and plan above

## 2022-12-27 ENCOUNTER — Inpatient Hospital Stay (HOSPITAL_COMMUNITY): Payer: 59

## 2022-12-27 ENCOUNTER — Other Ambulatory Visit (HOSPITAL_COMMUNITY): Payer: Self-pay

## 2022-12-27 DIAGNOSIS — I2693 Single subsegmental pulmonary embolism without acute cor pulmonale: Secondary | ICD-10-CM

## 2022-12-27 DIAGNOSIS — R7989 Other specified abnormal findings of blood chemistry: Secondary | ICD-10-CM | POA: Diagnosis not present

## 2022-12-27 DIAGNOSIS — I2699 Other pulmonary embolism without acute cor pulmonale: Secondary | ICD-10-CM | POA: Diagnosis not present

## 2022-12-27 DIAGNOSIS — I82452 Acute embolism and thrombosis of left peroneal vein: Secondary | ICD-10-CM | POA: Diagnosis not present

## 2022-12-27 DIAGNOSIS — E66812 Obesity, class 2: Secondary | ICD-10-CM | POA: Diagnosis not present

## 2022-12-27 LAB — COMPREHENSIVE METABOLIC PANEL
ALT: 8 U/L (ref 0–44)
AST: 14 U/L — ABNORMAL LOW (ref 15–41)
Albumin: 3.1 g/dL — ABNORMAL LOW (ref 3.5–5.0)
Alkaline Phosphatase: 58 U/L (ref 38–126)
Anion gap: 9 (ref 5–15)
BUN: 19 mg/dL (ref 6–20)
CO2: 23 mmol/L (ref 22–32)
Calcium: 8.7 mg/dL — ABNORMAL LOW (ref 8.9–10.3)
Chloride: 103 mmol/L (ref 98–111)
Creatinine, Ser: 0.73 mg/dL (ref 0.44–1.00)
GFR, Estimated: >60 mL/min (ref >60)
Glucose, Bld: 115 mg/dL — ABNORMAL HIGH (ref 70–99)
Potassium: 3.5 mmol/L (ref 3.5–5.1)
Sodium: 135 mmol/L (ref 135–145)
Total Bilirubin: 0.4 mg/dL (ref <1.2)
Total Protein: 6.9 g/dL (ref 6.5–8.1)

## 2022-12-27 LAB — ECHOCARDIOGRAM COMPLETE
AR max vel: 3.11 cm2
AV Area VTI: 3.02 cm2
AV Area mean vel: 3.07 cm2
AV Mean grad: 4 mm[Hg]
AV Peak grad: 7 mm[Hg]
Ao pk vel: 1.32 m/s
Area-P 1/2: 4.41 cm2
Height: 65 in
S' Lateral: 2.8 cm
Weight: 3776 [oz_av]

## 2022-12-27 LAB — CBC
HCT: 32.6 % — ABNORMAL LOW (ref 36.0–46.0)
Hemoglobin: 9.9 g/dL — ABNORMAL LOW (ref 12.0–15.0)
MCH: 24.3 pg — ABNORMAL LOW (ref 26.0–34.0)
MCHC: 30.4 g/dL (ref 30.0–36.0)
MCV: 79.9 fL — ABNORMAL LOW (ref 80.0–100.0)
Platelets: 224 10*3/uL (ref 150–400)
RBC: 4.08 MIL/uL (ref 3.87–5.11)
RDW: 20.1 % — ABNORMAL HIGH (ref 11.5–15.5)
WBC: 14.3 10*3/uL — ABNORMAL HIGH (ref 4.0–10.5)
nRBC: 0 % (ref 0.0–0.2)

## 2022-12-27 LAB — MAGNESIUM: Magnesium: 2 mg/dL (ref 1.7–2.4)

## 2022-12-27 LAB — HEPARIN LEVEL (UNFRACTIONATED): Heparin Unfractionated: 0.53 [IU]/mL (ref 0.30–0.70)

## 2022-12-27 MED ORDER — APIXABAN 5 MG PO TABS: 5.0000 mg | ORAL_TABLET | Freq: Two times a day (BID) | ORAL | Status: DC

## 2022-12-27 MED ORDER — APIXABAN 5 MG PO TABS
10.0000 mg | ORAL_TABLET | Freq: Two times a day (BID) | ORAL | Status: DC
  Administered 2022-12-27 – 2022-12-28 (×3): 10 mg via ORAL
  Filled 2022-12-27 (×3): qty 2

## 2022-12-27 NOTE — Plan of Care (Signed)

## 2022-12-27 NOTE — Assessment & Plan Note (Signed)
 Patient reports a 1 month history of left lower extremity swelling prior to presenting with her current symptoms.   See assessment and plan above

## 2022-12-27 NOTE — Progress Notes (Signed)
Patient informed this nurse that she has multiple stairs at work and is concerned that she will not be able to go to work once discharged; high possibility her work will allow her to work from home if she has a note from the provider to advocate for this setting until she has recovered from PE/hospitalization. Informed Dr. Leafy Half of patient's request.

## 2022-12-27 NOTE — Progress Notes (Addendum)
PHARMACY - ANTICOAGULATION CONSULT NOTE  Pharmacy Consult for IV heparin Indication: pulmonary embolus  No Known Allergies  Patient Measurements: Height: 5\' 5"  (165.1 cm) Weight: 107 kg (236 lb) IBW/kg (Calculated) : 57 Heparin Dosing Weight: 82 kg  Vital Signs: Temp: 97.9 F (36.6 C) (11/15 0420) Temp Source: Oral (11/15 0420) BP: 134/113 (11/15 0600) Pulse Rate: 114 (11/15 0600)  Labs: Recent Labs    12/25/22 1618 12/25/22 2034 12/26/22 0215 12/26/22 0440 12/26/22 0945 12/26/22 2029 12/27/22 0252  HGB 11.1*  --   --  11.1*  --   --   --   HCT 36.2  --   --  36.4  --   --   --   PLT 231  --   --  248  --   --   --   APTT  --   --   --  89*  --  59*  --   LABPROT  --   --   --  13.9  --  13.7  --   INR  --   --   --  1.0  --  1.0  --   HEPARINUNFRC  --   --  0.41  --  0.40  --  0.53  CREATININE 0.73  --   --  0.82  --   --  0.73  TROPONINIHS 51* 42*  --  23*  --   --   --     Estimated Creatinine Clearance: 105.7 mL/min (by C-G formula based on SCr of 0.73 mg/dL).   Medical History: History reviewed. No pertinent past medical history.  Medications:  Scheduled:   Chlorhexidine Gluconate Cloth  6 each Topical Daily   sodium chloride flush  3 mL Intravenous Q12H   Infusions:   heparin 1,500 Units/hr (12/27/22 0429)    Assessment: 56 yoF with PMH significant only for obesity and recent LLE swelling x 1 month presents with worsening shortness of breath. CTA shows diffuse subsegmental and segmental PE, possible developing right heart strain. Accompanying VAS [place] was positive for L peroneal vein DVT. Patient was deemed not to be a candidate for thrombolysis or thrombectomy.  Today, 12/27/22 Daily heparin level therapeutic on 1500 units/hr Hgb down 1.2g from yesterday, although Plt remain stable WNL No overt bleeding or infusion issues per RN; with patient currently in menses however, difficult to confidently rule out some degree of hematuria SCr stable <1  (baseline)  Goal of Therapy:  Heparin level 0.3-0.7 units/ml Monitor platelets by anticoagulation protocol: Yes   Plan:  Continue IV heparin infusion at 1500 units/hr Monitor daily heparin level, CBC, signs/symptoms of bleeding F/U long term oral anticoagulation plan   Thank you for allowing pharmacy to be a part of this patient's care.  Bernadene Person, PharmD, BCPS [phone] 12/27/2022, 7:30 AM

## 2022-12-27 NOTE — Progress Notes (Signed)
Brief Nutrition Note  Received consult for "obesity". Met with patient at bedside.  She reports a UBW of 200# and that her weight has been stable. Typically eats a piece of fruit for breakfast, salmon or chicken with fruit for lunch, and a protein with a side dish for dinner. Only drinks water and pineapple juice. Appetite has been normal recently.  Current appetite is good and patient reports having a good breakfast. No depletions noted. Patient is stable. She has no nutrition questions or concerns.  Weight loss counseling is not indicated or appropriate during acute illness. Once patient ready for discharge, could consider outpatient dietitian referral, per MD discretion.  Encouraged intake of 3 healthful meals a day emphasizing protein rich food sources with fresh fruits and vegetables.  Will sign off at this time. Please re-consult as  needed.   Shelle Iron RD, LDN For contact information, refer to [facility].

## 2022-12-27 NOTE — Assessment & Plan Note (Addendum)
Transitioning patient to oral anticoagulation Supplemental oxygen for bouts of hypoxia  Transitioning out of stepdown unit  Patient will need to see hematology in the outpatient setting for eventual hypercoagulable workup so that testing can be timed around discontinuation of anticoagulation. Patient seems to have injured her left leg approximately 1 month ago in the same extremity that has a concurrent DVT.  Unclear as to whether this could have provoked a cascade of coagulation.

## 2022-12-27 NOTE — Assessment & Plan Note (Signed)
Once patient is clinically improved we will counsel on caloric restriction and regular physical activity

## 2022-12-27 NOTE — Discharge Instructions (Signed)
Please take all prescribed medications exactly as instructed including your Eliquis, the blood thinner for your lung and leg blood clots. Please consume a regular diet Please increase your physical activity as tolerated.  Avoid strenuous physical activity or exercise until cleared by your patient medical provider.  Please maintain all outpatient follow-up appointments including follow-up with your primary care provider.   Please additionally request an outpatient referral to hematology for consideration of evaluation for a hypercoagulable state. Please return to the emergency department if you develop worsening shortness of breath, fevers in excess of 100.4 F chest pain or leg swelling. Please additionally notify your provider if you are experiencing any evidence of increased bleeding. Inquire with your provider about a workup for iron deficiency anemia.  You may require daily iron supplements if this workup identifies that you are iron deficient.

## 2022-12-27 NOTE — TOC Benefit Eligibility Note (Signed)
Patient Product/process development scientist completed.    The patient is insured through [employer]. Patient has [employer], may use a copay card, and/or apply for patient assistance if available.    Ran test claim for Eliquis 5 mg and the current 30 day co-pay is $114.22.  Ran test claim for Xarelto 20 mg and the current 30 day co-pay is $109.46  This test claim was processed through [employer]- copay amounts may vary at other pharmacies due to [employer], or as the patient moves through the different stages of their insurance plan.     Roland Earl, CPHT Pharmacy Technician III Certified Patient Advocate [facility] Patient Advocate Team Direct Number: [phone]  Fax: [phone]

## 2022-12-27 NOTE — Progress Notes (Signed)
PROGRESS NOTE   Yvette Campos  ZOX:[id number] DOB: 28-Jun-1975 DOA: 12/25/2022 PCP: Myrlene Broker, MD   Date of Service: the patient was seen and examined on 12/27/2022  Brief Narrative:  47 y.o. female with no significant past medical history presenting to [facility] emergency department with a 4-day history of increasing shortness of breath and lightheadedness as well as exertional dyspnea.   Upon evaluation in the emergency department patient was found to have diffuse subsegmental and segmental pulmonary emboli with evidence of right heart strain.  Patient was initiated on heparin infusion and the hospitalist group was then called to assess the patient for admission to the hospital.    Case was discussed with pulmonology.  Case and images were reviewed and it was felt the patient was not a candidate for thrombectomy or thrombolysis.   Assessment & Plan Acute pulmonary embolism [facility]) Transitioning patient to oral anticoagulation Supplemental oxygen for bouts of hypoxia  Transitioning out of stepdown unit  Patient will need to see hematology in the outpatient setting for eventual hypercoagulable workup so that testing can be timed around discontinuation of anticoagulation. Patient seems to have injured her left leg approximately 1 month ago in the same extremity that has a concurrent DVT.  Unclear as to whether this could have provoked a cascade of coagulation.    Acute deep vein thrombosis (DVT) of left peroneal vein (HCC) Patient reports a 1 month history of left lower extremity swelling prior to presenting with her current symptoms.   See assessment and plan above Elevated troponin level not due myocardial infarction Patient is chest pain-free No dynamic ST segment change on EKG Mild troponin elevation likely secondary to right heart strain.  Troponin elevation is expected to be self-limited and to downtrend with management of underlying pulmonary embolism. Class  2 severe obesity due to excess calories with serious comorbidity and body mass index (BMI) of 39.0 to 39.9 in adult [facility]) Once patient is clinically improved we will counsel on caloric restriction and regular physical activity.     Subjective:  Patient still complaining of occasional chest discomfort, mild in intensity, located in the anterior chest, nonradiating, associated with generalized weakness and mild shortness of breath.  Physical Exam:  Vitals:   12/27/22 0330 12/27/22 0420 12/27/22 0500 12/27/22 0600  BP:  (!) 141/88 115/69 (!) 134/113  Pulse:  75 75 (!) 114  Resp:  12 17 20   Temp: 97.9 F (36.6 C) 97.9 F (36.6 C)    TempSrc: Oral Oral    SpO2:  100% 96% 97%  Weight:      Height:        Constitutional: Awake alert and oriented x3, no associated distress.   Skin: no rashes, no lesions, good skin turgor noted. Eyes: Pupils are equally reactive to light.  No evidence of scleral icterus or conjunctival pallor.  ENMT: Moist mucous membranes noted.  Posterior pharynx clear of any exudate or lesions.   Respiratory: clear to auscultation bilaterally, no wheezing, no crackles. Normal respiratory effort. No accessory muscle use.  Cardiovascular: Regular rate and rhythm, no murmurs / rubs / gallops. No extremity edema. 2+ pedal pulses. No carotid bruits.  Abdomen: Abdomen is soft and nontender.  No evidence of intra-abdominal masses.  Positive bowel sounds noted in all quadrants.   Musculoskeletal: No joint deformity upper and lower extremities. Good ROM, no contractures. Normal muscle tone.    Data Reviewed:  I have personally reviewed and interpreted labs, imaging.  Significant findings are  CBC: Recent Labs  Lab 12/25/22 1618 12/26/22 0440 12/27/22 0654  WBC 7.6 9.7 14.3*  NEUTROABS 4.3  --   --   HGB 11.1* 11.1* 9.9*  HCT 36.2 36.4 32.6*  MCV 79.4* 79.0* 79.9*  PLT 231 248 224   Basic Metabolic Panel: Recent Labs  Lab 12/25/22 1618 12/26/22 0440  12/27/22 0252  NA 136 133* 135  K 3.8 4.0 3.5  CL 102 101 103  CO2 23 23 23   GLUCOSE 100* 190* 115*  BUN 11 15 19   CREATININE 0.73 0.82 0.73  CALCIUM 8.8* 9.0 8.7*  MG  --   --  2.0   GFR: Estimated Creatinine Clearance: 105.7 mL/min (by C-G formula based on SCr of 0.73 mg/dL). Liver Function Tests: Recent Labs  Lab 12/25/22 1618 12/27/22 0252  AST 17 14*  ALT 8 8  ALKPHOS 70 58  BILITOT 0.7 0.4  PROT 7.8 6.9  ALBUMIN 3.6 3.1*    Coagulation Profile: Recent Labs  Lab 12/26/22 0440 12/26/22 2029  INR 1.0 1.0     Telemetry: Personally reviewed.  Rhythm is normal sinus rhythm.    Code Status:  Full code.  Code status decision has been confirmed with: patient    Severity of Illness:  The appropriate patient status for this patient is INPATIENT. Inpatient status is judged to be reasonable and necessary in order to provide the required intensity of service to ensure the patient's safety. The patient's presenting symptoms, physical exam findings, and initial radiographic and laboratory data in the context of their chronic comorbidities is felt to place them at high risk for further clinical deterioration. Furthermore, it is not anticipated that the patient will be medically stable for discharge from the hospital within 2 midnights of admission.   * I certify that at the point of admission it is my clinical judgment that the patient will require inpatient hospital care spanning beyond 2 midnights from the point of admission due to high intensity of service, high risk for further deterioration and high frequency of surveillance required.*  Time spent:  37 minutes  Author:  Marinda Elk MD  12/27/2022 8:53 AM

## 2022-12-27 NOTE — Assessment & Plan Note (Signed)
 Patient is chest pain-free No dynamic ST segment change on EKG Mild troponin elevation likely secondary to right heart strain.  Troponin elevation is expected to be self-limited and to downtrend with management of underlying pulmonary embolism.

## 2022-12-28 ENCOUNTER — Other Ambulatory Visit (HOSPITAL_COMMUNITY): Payer: Self-pay

## 2022-12-28 DIAGNOSIS — E66812 Obesity, class 2: Secondary | ICD-10-CM | POA: Diagnosis not present

## 2022-12-28 DIAGNOSIS — R7989 Other specified abnormal findings of blood chemistry: Secondary | ICD-10-CM | POA: Diagnosis not present

## 2022-12-28 DIAGNOSIS — I82452 Acute embolism and thrombosis of left peroneal vein: Secondary | ICD-10-CM | POA: Diagnosis not present

## 2022-12-28 DIAGNOSIS — I2699 Other pulmonary embolism without acute cor pulmonale: Secondary | ICD-10-CM | POA: Diagnosis not present

## 2022-12-28 LAB — CBC
HCT: 32.4 % — ABNORMAL LOW (ref 36.0–46.0)
Hemoglobin: 9.9 g/dL — ABNORMAL LOW (ref 12.0–15.0)
MCH: 24.1 pg — ABNORMAL LOW (ref 26.0–34.0)
MCHC: 30.6 g/dL (ref 30.0–36.0)
MCV: 78.8 fL — ABNORMAL LOW (ref 80.0–100.0)
Platelets: 238 10*3/uL (ref 150–400)
RBC: 4.11 MIL/uL (ref 3.87–5.11)
RDW: 19.8 % — ABNORMAL HIGH (ref 11.5–15.5)
WBC: 8.5 10*3/uL (ref 4.0–10.5)
nRBC: 0 % (ref 0.0–0.2)

## 2022-12-28 LAB — IRON AND TIBC
Iron: 23 ug/dL — ABNORMAL LOW (ref 28–170)
Saturation Ratios: 5 % — ABNORMAL LOW (ref 10.4–31.8)
TIBC: 428 ug/dL (ref 250–450)
UIBC: 405 ug/dL

## 2022-12-28 LAB — FERRITIN: Ferritin: 7 ng/mL — ABNORMAL LOW (ref 11–307)

## 2022-12-28 MED ORDER — APIXABAN 5 MG PO TABS
5.0000 mg | ORAL_TABLET | Freq: Two times a day (BID) | ORAL | 1 refills | Status: DC
  Filled 2022-12-28 – 2023-01-14 (×2): qty 60, 30d supply, fill #0

## 2022-12-28 MED ORDER — APIXABAN 5 MG PO TABS
ORAL_TABLET | ORAL | 0 refills | Status: DC
  Filled 2022-12-28: qty 60, 24d supply, fill #0

## 2022-12-28 NOTE — Plan of Care (Signed)
  Problem: Education: Goal: Knowledge of General Education information will improve Description: Including pain rating scale, medication(s)/side effects and non-pharmacologic comfort measures Outcome: Progressing   Problem: Clinical Measurements: Goal: Ability to maintain clinical measurements within normal limits will improve Outcome: Progressing   Problem: Activity: Goal: Risk for activity intolerance will decrease Outcome: Progressing   Problem: Nutrition: Goal: Adequate nutrition will be maintained Outcome: Progressing   Problem: Elimination: Goal: Will not experience complications related to bowel motility Outcome: Progressing   Problem: Safety: Goal: Ability to remain free from injury will improve Outcome: Progressing   Problem: Skin Integrity: Goal: Risk for impaired skin integrity will decrease Outcome: Progressing   

## 2022-12-30 ENCOUNTER — Telehealth: Payer: Self-pay

## 2022-12-30 NOTE — Transitions of Care (Post Inpatient/ED Visit) (Unsigned)
   12/30/2022  Name: Yvette Campos MRN: [id number] DOB: 08/10/1975  Today's TOC FU Call Status: Today's TOC FU Call Status:: Unsuccessful Call (1st Attempt) Unsuccessful Call (1st Attempt) Date: 12/30/22  Attempted to reach the patient regarding the most recent Inpatient/ED visit.  Follow Up Plan: Additional outreach attempts will be made to reach the patient to complete the Transitions of Care (Post Inpatient/ED visit) call.   Signature Karena Addison, LPN [facility] Nurse Health Advisor Direct Dial [phone]

## 2022-12-31 NOTE — Transitions of Care (Post Inpatient/ED Visit) (Signed)
   12/31/2022  Name: Yvette Campos MRN: [id number] DOB: Aug 18, 1975  Today's TOC FU Call Status: Today's TOC FU Call Status:: Successful TOC FU Call Completed Unsuccessful Call (1st Attempt) Date: 12/30/22 [facility] FU Call Complete Date: 12/31/22 Patient's Name and Date of Birth confirmed.  Transition Care Management Follow-up Telephone Call Date of Discharge: 12/28/22 Discharge Facility: [facility]) Type of Discharge: Inpatient Admission Primary Inpatient Discharge Diagnosis:: PE How have you been since you were released from the hospital?: Better Any questions or concerns?: No  Items Reviewed: Did you receive and understand the discharge instructions provided?: Yes Medications obtained,verified, and reconciled?: Yes (Medications Reviewed) Any new allergies since your discharge?: No Dietary orders reviewed?: Yes Do you have support at home?: No  Medications Reviewed Today: Medications Reviewed Today     Reviewed by Karena Addison, LPN (Licensed Practical Nurse) on 12/31/22 at 1206  Med List Status: <None>   Medication Order Taking? Sig Documenting Provider Last Dose Status Informant  apixaban (ELIQUIS) 5 MG TABS tablet [id number]  Take 2 tablets (10 mg total) by mouth 2 (two) times daily for 6 days, THEN 1 tablet (5 mg total) 2 (two) times daily for 24 days. Use this prescription for Month 1. Shalhoub, Deno Lunger, MD  Active   apixaban (ELIQUIS) 5 MG TABS tablet [id number]  Take 1 tablet (5 mg total) by mouth 2 (two) times daily. Use this prescription for subsequent months Shalhoub, Deno Lunger, MD  Active             Home Care and Equipment/Supplies: Were Home Health Services Ordered?: NA Any new equipment or medical supplies ordered?: NA  Functional Questionnaire: Do you need assistance with bathing/showering or dressing?: No Do you need assistance with meal preparation?: No Do you need assistance with eating?: No Do you have difficulty maintaining continence: No Do you  need assistance with getting out of bed/getting out of a chair/moving?: No Do you have difficulty managing or taking your medications?: No  Follow up appointments reviewed: PCP Follow-up appointment confirmed?: Yes Date of PCP follow-up appointment?: 01/01/23 Follow-up Provider: [facility] Follow-up appointment confirmed?: NA Do you need transportation to your follow-up appointment?: No Do you understand care options if your condition(s) worsen?: Yes-patient verbalized understanding    SIGNATURE Karena Addison, LPN [facility] Nurse Health Advisor Direct Dial [phone]

## 2023-01-01 ENCOUNTER — Ambulatory Visit (INDEPENDENT_AMBULATORY_CARE_PROVIDER_SITE_OTHER): Payer: BLUE CROSS/BLUE SHIELD | Admitting: Internal Medicine

## 2023-01-01 ENCOUNTER — Encounter: Payer: Self-pay | Admitting: Internal Medicine

## 2023-01-01 VITALS — BP 124/83 | HR 85 | Temp 98.5°F | Ht 65.0 in | Wt 220.0 lb

## 2023-01-01 DIAGNOSIS — I2699 Other pulmonary embolism without acute cor pulmonale: Secondary | ICD-10-CM | POA: Diagnosis not present

## 2023-01-01 NOTE — Patient Instructions (Signed)
We want you to check in about 1 month to see how you are doing. The blood clot will dissolve slowly over the next 4-6 weeks.

## 2023-01-01 NOTE — Progress Notes (Signed)
   Subjective:   Patient ID: Yvette Campos, female    DOB: 02/07/1976, 47 y.o.   MRN: [id number]  HPI The patient is a 47 YO female coming in for hospital follow up (in for multiple PE and DVT unprovoked). She was started on eliquis. No family history of blood clots. Not on OCP and non-smoker. Did travel to [facility] and did not stop (about 5 hour trip). Needs to be out of work due to breathlessness.  PMH, [facility], social history reviewed and updated  Review of Systems  Constitutional:  Positive for activity change and fatigue.  HENT: Negative.    Eyes: Negative.   Respiratory:  Positive for shortness of breath. Negative for cough and chest tightness.   Cardiovascular:  Negative for chest pain, palpitations and leg swelling.  Gastrointestinal:  Negative for abdominal distention, abdominal pain, constipation, diarrhea, nausea and vomiting.  Musculoskeletal: Negative.   Skin: Negative.   Neurological: Negative.   Psychiatric/Behavioral: Negative.      Objective:  Physical Exam Constitutional:      Appearance: She is well-developed.  HENT:     Head: Normocephalic and atraumatic.  Cardiovascular:     Rate and Rhythm: Normal rate and regular rhythm.  Pulmonary:     Effort: Pulmonary effort is normal. No respiratory distress.     Breath sounds: Normal breath sounds. No wheezing or rales.     Comments: With some breathlessness with talking during visit Abdominal:     General: Bowel sounds are normal. There is no distension.     Palpations: Abdomen is soft.     Tenderness: There is no abdominal tenderness. There is no rebound.  Musculoskeletal:     Cervical back: Normal range of motion.  Skin:    General: Skin is warm and dry.  Neurological:     Mental Status: She is alert and oriented to person, place, and time.     Coordination: Coordination normal.     Vitals:   01/01/23 1036  BP: 124/83  Pulse: 85  Temp: 98.5 F (36.9 C)  TempSrc: Oral  SpO2: 100%  Weight: 220 lb (99.8  kg)  Height: 5\' 5"  (1.651 m)    Assessment & Plan:  Visit time 30 minutes in face to face communication with patient and coordination of care, additional 15 minutes spent in record review, coordination or care, ordering tests, communicating/referring to other healthcare professionals, documenting in medical records all on the same day of the visit for total time 45 minutes spent on the visit.

## 2023-01-03 ENCOUNTER — Encounter: Payer: Self-pay | Admitting: Internal Medicine

## 2023-01-03 NOTE — Assessment & Plan Note (Signed)
Not provoked and hypercoagulable workup not done in hospital. Will need this done at 3 month mark. We had an extensive discussion about need to have this done as well as d-dimer at 3 month mark. If workup and d-dimer normal can stop. Have recommended to stop and walk every 2 hours while traveling lifelong.

## 2023-01-07 ENCOUNTER — Encounter: Payer: Self-pay | Admitting: Internal Medicine

## 2023-01-07 NOTE — Discharge Summary (Addendum)
Physician Discharge Summary   Patient: Yvette Campos MRN: [id number] DOB: 03/31/1975  Admit date:     12/25/2022  Discharge date: 12/28/2022  Discharge Physician: Marinda Elk   PCP: Myrlene Broker, MD   Recommendations at discharge:    Please take all prescribed medications exactly as instructed including your Eliquis, the blood thinner for your lung and leg blood clots. Please consume a regular diet Please increase your physical activity as tolerated.  Avoid strenuous physical activity or exercise until cleared by your patient medical provider.  Please maintain all outpatient follow-up appointments including follow-up with your primary care provider.   Please additionally request an outpatient referral to hematology for consideration of evaluation for a hypercoagulable state. Please return to the emergency department if you develop worsening shortness of breath, fevers in excess of 100.4 F chest pain or leg swelling. Please additionally notify your provider if you are experiencing any evidence of increased bleeding. Inquire with your provider about a workup for iron deficiency anemia.  You may require daily iron supplements if this workup identifies that you are iron deficient.  This elderly games and metric prime are unbelievable  Discharge Diagnoses: Principal Problem:   Acute pulmonary embolism (HCC) Active Problems:   Class 2 severe obesity due to excess calories with serious comorbidity and body mass index (BMI) of 39.0 to 39.9 in adult [facility])  Resolved Problems:   Elevated troponin level not due myocardial infarction   Acute deep vein thrombosis (DVT) of left peroneal vein [facility])   Hospital Course: 47 y.o. female with no significant past medical history presenting to [facility] emergency department with a 4-day history of increasing shortness of breath and lightheadedness as well as exertional dyspnea.   Upon evaluation in the emergency department  patient was found to have diffuse subsegmental and segmental pulmonary emboli with evidence of right heart strain.  Patient was initiated on heparin infusion and the hospitalist group was then called to assess the patient for admission to the hospital.    Case was discussed with pulmonology.  Case and images were reviewed and it was felt the patient was not a candidate for thrombectomy or thrombolysis.  Patient was monitored closely in the stepdown unit and slowly clinically improved in the following 48 hours.  Patient was monitored on telemetry without any evidence of arrhythmic event.  Echocardiogram was obtained and while that revealed left ventricular hypertrophy revealed no evidence confirming right heart strain.  Of note, patient was additionally found to have DVT of the left peroneal vein identified on bilateral lower extremity ultrasound.  Clinically, it was felt that the plan for anticoagulation would be adequate managing this in addition to the pulmonary emboli.  With improving chest pain and shortness of breath patient was eventually transition to oral Eliquis for continued anticoagulation.  Patient was monitored while hospitalized for an additional 24 hours without events.  Patient was discharged home in improved and stable condition on 12/28/2022.  At time of discharge, patient was instructed to follow up closely with her primary care provider and to inquire about outpatient hematology evaluation for consideration of hypercoagulable workup at some point in the near future.  Consultants: None Procedures performed: None  Disposition: Home Diet recommendation:  Discharge Diet Orders (From admission, onward)     Start     Ordered   12/28/22 0000  Diet general        12/28/22 1237           Regular diet  DISCHARGE MEDICATION: Allergies as of 12/28/2022   No Known Allergies      Medication List     TAKE these medications    Eliquis 5 MG Tabs tablet Generic drug:  apixaban Take 2 tablets (10 mg total) by mouth 2 (two) times daily for 6 days, THEN 1 tablet (5 mg total) 2 (two) times daily for 24 days. Use this prescription for Month 1. Start taking on: December 28, 2022   apixaban 5 MG Tabs tablet Commonly known as: ELIQUIS Take 1 tablet (5 mg total) by mouth 2 (two) times daily. Use this prescription for subsequent months         Discharge Exam: Filed Weights   12/25/22 1404  Weight: 107 kg    Constitutional: Awake alert and oriented x3, no associated distress.   Respiratory: clear to auscultation bilaterally, no wheezing, no crackles. Normal respiratory effort. No accessory muscle use.  Cardiovascular: Regular rate and rhythm, no murmurs / rubs / gallops. No extremity edema. 2+ pedal pulses. No carotid bruits.  Abdomen: Abdomen is soft and nontender.  No evidence of intra-abdominal masses.  Positive bowel sounds noted in all quadrants.   Musculoskeletal: No joint deformity upper and lower extremities. Good ROM, no contractures. Normal muscle tone.     Condition at discharge: fair  The results of significant diagnostics from this hospitalization (including imaging, microbiology, ancillary and laboratory) are listed below for reference.   Imaging Studies: ECHOCARDIOGRAM COMPLETE  Result Date: 12/27/2022    ECHOCARDIOGRAM REPORT   Patient Name:   Yvette Campos Date of Exam: 12/27/2022 Medical Rec #:  [id number]      Height:       65.0 in Accession #:    [id number]     Weight:       236.0 lb Date of Birth:  05/08/1975      BSA:          2.122 m Patient Age:    47 years       BP:           141/95 mmHg Patient Gender: F              HR:           88 bpm. Exam Location:  Inpatient Procedure: 2D Echo, Color Doppler and Cardiac Doppler Indications:    Pulmonary Embolism  History:        Patient has no prior history of Echocardiogram examinations.                 Acute PE.  Sonographer:    Milbert Coulter Referring Phys: [id number] Deno Lunger Sabrina Keough  IMPRESSIONS  1. Left ventricular ejection fraction, by estimation, is 60 to 65%. The left ventricle has normal function. Left ventricular endocardial border not optimally defined to evaluate regional wall motion. There is severe concentric left ventricular hypertrophy. Left ventricular diastolic parameters were normal.  2. Right ventricular systolic function is normal. The right ventricular size is normal. Tricuspid regurgitation signal is inadequate for assessing PA pressure.  3. The mitral valve is normal in structure. No evidence of mitral valve regurgitation. No evidence of mitral stenosis.  4. The aortic valve is normal in structure. Aortic valve regurgitation is not visualized. No aortic stenosis is present.  5. The inferior vena cava is normal in size with greater than 50% respiratory variability, suggesting right atrial pressure of 3 mmHg. FINDINGS  Left Ventricle: Left ventricular ejection fraction, by estimation, is 60 to 65%. The left ventricle has  normal function. Left ventricular endocardial border not optimally defined to evaluate regional wall motion. The left ventricular internal cavity size was normal in size. There is severe concentric left ventricular hypertrophy. Left ventricular diastolic parameters were normal. Normal left ventricular filling pressure. Right Ventricle: The right ventricular size is normal. No increase in right ventricular wall thickness. Right ventricular systolic function is normal. Tricuspid regurgitation signal is inadequate for assessing PA pressure. Left Atrium: Left atrial size was normal in size. Right Atrium: Right atrial size was normal in size. Pericardium: There is no evidence of pericardial effusion. Mitral Valve: The mitral valve is normal in structure. No evidence of mitral valve regurgitation. No evidence of mitral valve stenosis. Tricuspid Valve: The tricuspid valve is normal in structure. Tricuspid valve regurgitation is not demonstrated. No evidence of  tricuspid stenosis. Aortic Valve: The aortic valve is normal in structure. Aortic valve regurgitation is not visualized. No aortic stenosis is present. Aortic valve mean gradient measures 4.0 mmHg. Aortic valve peak gradient measures 7.0 mmHg. Aortic valve area, by VTI measures 3.02 cm. Pulmonic Valve: The pulmonic valve was normal in structure. Pulmonic valve regurgitation is not visualized. No evidence of pulmonic stenosis. Aorta: The aortic root is normal in size and structure. Venous: The inferior vena cava is normal in size with greater than 50% respiratory variability, suggesting right atrial pressure of 3 mmHg. IAS/Shunts: No atrial level shunt detected by color flow Doppler.  LEFT VENTRICLE PLAX 2D LVIDd:         3.60 cm   Diastology LVIDs:         2.80 cm   LV e' medial:    10.00 cm/s LV PW:         1.70 cm   LV E/e' medial:  8.1 LV IVS:        1.80 cm   LV e' lateral:   11.30 cm/s LVOT diam:     2.20 cm   LV E/e' lateral: 7.2 LV SV:         70 LV SV Index:   33 LVOT Area:     3.80 cm  RIGHT VENTRICLE RV Basal diam:  3.80 cm RV Mid diam:    3.00 cm RV S prime:     12.80 cm/s TAPSE (M-mode): 1.9 cm LEFT ATRIUM             Index        RIGHT ATRIUM           Index LA diam:        3.20 cm 1.51 cm/m   RA Area:     15.80 cm LA Vol (A2C):   52.3 ml 24.64 ml/m  RA Volume:   41.00 ml  19.32 ml/m LA Vol (A4C):   49.8 ml 23.47 ml/m LA Biplane Vol: 50.9 ml 23.98 ml/m  AORTIC VALVE AV Area (Vmax):    3.11 cm AV Area (Vmean):   3.07 cm AV Area (VTI):     3.02 cm AV Vmax:           132.00 cm/s AV Vmean:          90.900 cm/s AV VTI:            0.230 m AV Peak Grad:      7.0 mmHg AV Mean Grad:      4.0 mmHg LVOT Vmax:         108.00 cm/s LVOT Vmean:        73.300 cm/s LVOT VTI:  0.183 m LVOT/AV VTI ratio: 0.80  AORTA Ao Root diam: 2.80 cm Ao Asc diam:  3.00 cm MITRAL VALVE MV Area (PHT): 4.41 cm    SHUNTS MV Decel Time: 172 msec    Systemic VTI:  0.18 m MV E velocity: 81.40 cm/s  Systemic Diam: 2.20 cm  MV A velocity: 87.40 cm/s MV E/A ratio:  0.93 Armanda Magic MD Electronically signed by Armanda Magic MD Signature Date/Time: 12/27/2022/3:20:59 PM    Final    VAS [place] LOWER EXTREMITY VENOUS (DVT)  Result Date: 12/26/2022  Lower Venous DVT Study Patient Name:  MCCLAIN CZEPIEL  Date of Exam:   12/26/2022 Medical Rec #: [id number]       Accession #:    [id number] Date of Birth: [birth year]       Patient Gender: F Patient Age:   45 years Exam Location:  [facility] Procedure:      VAS [place] LOWER EXTREMITY VENOUS (DVT) Referring Phys: [facility] GOEL --------------------------------------------------------------------------------  Indications: Swelling.  Risk Factors: Confirmed PE. Anticoagulation: Heparin. Limitations: Poor ultrasound/tissue interface. Comparison Study: No prior studies. Performing Technologist: Chanda Busing RVT  Examination Guidelines: A complete evaluation includes B-mode imaging, spectral Doppler, color Doppler, and power Doppler as needed of all accessible portions of each vessel. Bilateral testing is considered an integral part of a complete examination. Limited examinations for reoccurring indications may be performed as noted. The reflux portion of the exam is performed with the patient in reverse Trendelenburg.  +---------+---------------+---------+-----------+----------+--------------+ RIGHT    CompressibilityPhasicitySpontaneityPropertiesThrombus Aging +---------+---------------+---------+-----------+----------+--------------+ CFV      Full           Yes      Yes                                 +---------+---------------+---------+-----------+----------+--------------+ SFJ      Full                                                        +---------+---------------+---------+-----------+----------+--------------+ FV Prox  Full                                                        +---------+---------------+---------+-----------+----------+--------------+ FV Mid    Full                                                        +---------+---------------+---------+-----------+----------+--------------+ FV DistalFull                                                        +---------+---------------+---------+-----------+----------+--------------+ PFV      Full                                                        +---------+---------------+---------+-----------+----------+--------------+  POP      Full           Yes      Yes                                 +---------+---------------+---------+-----------+----------+--------------+ PTV      Full                                                        +---------+---------------+---------+-----------+----------+--------------+ PERO     Full                                                        +---------+---------------+---------+-----------+----------+--------------+   +---------+---------------+---------+-----------+----------+--------------+ LEFT     CompressibilityPhasicitySpontaneityPropertiesThrombus Aging +---------+---------------+---------+-----------+----------+--------------+ CFV      Full           Yes      Yes                                 +---------+---------------+---------+-----------+----------+--------------+ SFJ      Full                                                        +---------+---------------+---------+-----------+----------+--------------+ FV Prox  Full                                                        +---------+---------------+---------+-----------+----------+--------------+ FV Mid   Full                                                        +---------+---------------+---------+-----------+----------+--------------+ FV DistalFull                                                        +---------+---------------+---------+-----------+----------+--------------+ PFV      Full                                                         +---------+---------------+---------+-----------+----------+--------------+ POP      Full           Yes      Yes                                 +---------+---------------+---------+-----------+----------+--------------+  PTV      Full                                                        +---------+---------------+---------+-----------+----------+--------------+ PERO     Partial                                      Acute          +---------+---------------+---------+-----------+----------+--------------+     Summary: RIGHT: - There is no evidence of deep vein thrombosis in the lower extremity.  - No cystic structure found in the popliteal fossa.  LEFT: - Findings consistent with acute deep vein thrombosis involving the left peroneal veins.  - No cystic structure found in the popliteal fossa.  *See table(s) above for measurements and observations. Electronically signed by Sherald Hess MD on 12/26/2022 at 1:28:31 PM.    Final    CT Angio Chest PE W and/or Wo Contrast  Addendum Date: 12/25/2022   ADDENDUM REPORT: 12/25/2022 18:12 ADDENDUM: These results were called by telephone at the time of interpretation on 12/25/2022 at 6:12 pm to provider DAVID YAO , who verbally acknowledged these results. Electronically Signed   By: Tish Frederickson M.D.   On: 12/25/2022 18:12   Result Date: 12/25/2022 CLINICAL DATA:  Pulmonary embolism (PE) suspected, high prob Patient came in for shortness of breath, patient states a few days ago she felt dizzy, sweaty, like she was going to faint-lasting about 5 minutes. Since then she has had increased shortness of breath with exertion EXAM: CT ANGIOGRAPHY CHEST WITH CONTRAST TECHNIQUE: Multidetector CT imaging of the chest was performed using the standard protocol during bolus administration of intravenous contrast. Multiplanar CT image reconstructions and MIPs were obtained to evaluate the vascular anatomy. RADIATION DOSE REDUCTION: This  exam was performed according to the departmental dose-optimization program which includes automated exposure control, adjustment of the mA and/or kV according to patient size and/or use of iterative reconstruction technique. CONTRAST:  75mL OMNIPAQUE IOHEXOL 350 MG/ML SOLN COMPARISON:  None Available. FINDINGS: Cardiovascular: Satisfactory opacification of the pulmonary arteries to the segmental level. All five lobes subsegmental and segmental pulmonary artery filling defects. The main pulmonary artery is normal in caliber. Straightening of the normal interventricular septum with RV to LV ratio of 1. Normal heart size. No significant pericardial effusion. The thoracic aorta is normal in caliber. No atherosclerotic plaque of the thoracic aorta. No coronary artery calcifications. Mediastinum/Nodes: No enlarged mediastinal, hilar, or axillary lymph nodes. Thyroid gland, trachea, and esophagus demonstrate no significant findings. Possible tiny hiatal hernia. Lungs/Pleura: No focal consolidation. No pulmonary nodule. No pulmonary mass. No pleural effusion. No pneumothorax. Upper Abdomen: No acute abnormality. Musculoskeletal: No chest wall abnormality. No suspicious lytic or blastic osseous lesions. No acute displaced fracture. Multilevel degenerative changes of the spine. Review of the MIP images confirms the above findings. IMPRESSION: All five lobes subsegmental and segmental pulmonary emboli. Possible developing right heart strain (RV LV ratio of 1). No associated pulmonary infarction. Electronically Signed: By: Tish Frederickson M.D. On: 12/25/2022 18:06   DG Chest 2 View  Result Date: 12/25/2022 CLINICAL DATA:  Shortness of breath. EXAM: CHEST - 2 VIEW COMPARISON:  February 13, 2022. FINDINGS: The heart size and mediastinal contours are  within normal limits. Both lungs are clear. The visualized skeletal structures are unremarkable. IMPRESSION: No active cardiopulmonary disease. Electronically Signed   By: Lupita Raider M.D.   On: 12/25/2022 16:42    Microbiology: Results for orders placed or performed in visit on 05/28/21  Urine Culture     Status: Abnormal   Collection Time: 05/28/21  2:05 PM   Specimen: Urine   Urine  Result Value Ref Range Status   Urine Culture, Routine Final report (A)  Final   Organism ID, Bacteria Escherichia coli (A)  Final    Comment: Cefazolin <=4 ug/mL Cefazolin with an MIC <=16 predicts susceptibility to the oral agents cefaclor, cefdinir, cefpodoxime, cefprozil, cefuroxime, cephalexin, and loracarbef when used for therapy of uncomplicated urinary tract infections due to E. coli, Klebsiella pneumoniae, and Proteus mirabilis. Greater than 100,000 colony forming units per mL    Antimicrobial Susceptibility Comment  Final    Comment:       ** S = Susceptible; I = Intermediate; R = Resistant **                    P = Positive; N = Negative             MICS are expressed in micrograms per mL    Antibiotic                 RSLT#1    RSLT#2    RSLT#3    RSLT#4 Amoxicillin/Clavulanic Acid    S Ampicillin                     S Cefepime                       S Ceftriaxone                    S Cefuroxime                     S Ciprofloxacin                  S Ertapenem                      S Gentamicin                     S Imipenem                       S Levofloxacin                   S Meropenem                      S Nitrofurantoin                 S Piperacillin/Tazobactam        S Tetracycline                   S Tobramycin                     S Trimethoprim/Sulfa             R     Discharge time spent: greater than 30 minutes.  Signed: Marinda Elk, MD [facility]

## 2023-01-08 NOTE — Telephone Encounter (Signed)
What information would be needed? Yvette believe you don't do disability

## 2023-01-13 NOTE — Telephone Encounter (Signed)
Placed inside box

## 2023-01-14 ENCOUNTER — Other Ambulatory Visit (HOSPITAL_COMMUNITY): Payer: Self-pay

## 2023-01-18 ENCOUNTER — Other Ambulatory Visit (HOSPITAL_COMMUNITY): Payer: Self-pay

## 2023-01-22 ENCOUNTER — Ambulatory Visit: Payer: BLUE CROSS/BLUE SHIELD

## 2023-01-23 ENCOUNTER — Ambulatory Visit: Payer: BLUE CROSS/BLUE SHIELD | Admitting: Family Medicine

## 2023-02-10 ENCOUNTER — Telehealth (INDEPENDENT_AMBULATORY_CARE_PROVIDER_SITE_OTHER): Payer: BLUE CROSS/BLUE SHIELD | Admitting: Internal Medicine

## 2023-02-10 ENCOUNTER — Encounter: Payer: Self-pay | Admitting: Internal Medicine

## 2023-02-10 DIAGNOSIS — Z86711 Personal history of pulmonary embolism: Secondary | ICD-10-CM

## 2023-02-10 DIAGNOSIS — Z0279 Encounter for issue of other medical certificate: Secondary | ICD-10-CM

## 2023-02-11 DIAGNOSIS — Z86711 Personal history of pulmonary embolism: Secondary | ICD-10-CM | POA: Insufficient documentation

## 2023-02-11 NOTE — Assessment & Plan Note (Signed)
 Taking eliquis  5 mg BID and doing well. No signs of bleeding. Will do hypercoagulable workup as well as d-dimer at 3 months or later and assess 3 versus 6 months of anticoagulation at that time. She is still struggling with stamina and will continue to gradually increase activity. Breathing with talking is now improved. She is struggling with some iADLS with completing them takes a lot of energy and she has to rest between activities.

## 2023-02-14 ENCOUNTER — Encounter: Payer: BLUE CROSS/BLUE SHIELD | Admitting: Internal Medicine

## 2023-02-17 ENCOUNTER — Telehealth: Payer: BLUE CROSS/BLUE SHIELD | Admitting: Internal Medicine

## 2023-02-18 ENCOUNTER — Telehealth: Payer: Self-pay | Admitting: Internal Medicine

## 2023-02-18 NOTE — Telephone Encounter (Signed)
 This has already been completed and fax backed to them on 1/3 confirmation was confirmed @4 :45 pm

## 2023-02-18 NOTE — Telephone Encounter (Signed)
 We have received short term disability forms and they have been placed in the providers box.   Please fax to:[phone]

## 2023-02-19 ENCOUNTER — Encounter: Payer: Self-pay | Admitting: Internal Medicine

## 2023-02-25 NOTE — Telephone Encounter (Signed)
 They are needing a letter statement from you as well to submit along with her disability

## 2023-02-26 NOTE — Telephone Encounter (Signed)
 They need a return date is February 24 th ok with you?

## 2023-03-11 ENCOUNTER — Telehealth: Payer: Self-pay | Admitting: Internal Medicine

## 2023-03-11 NOTE — Telephone Encounter (Signed)
Copied from CRM [id number]. Topic: Clinical - Medication Refill >> Mar 11, 2023  4:18 PM Tiffany H wrote: Most Recent Primary Care Visit:  Provider: Hillard Danker A  Department: [facility]  Visit Type: MYCHART VIDEO VISIT  Date: 02/10/2023  Medication: apixaban (ELIQUIS) 5 MG TABS table  Has the patient contacted their pharmacy? Yes (Agent: If no, request that the patient contact the pharmacy for the refill. If patient does not wish to contact the pharmacy document the reason why and proceed with request.) (Agent: If yes, when and what did the pharmacy advise?)  Is this the correct pharmacy for this prescription? Yes If no, delete pharmacy and type the correct one.  This is the patient's preferred pharmacy:   [facility] [address] [place], [place], [place] [address] [phone]  Has the prescription been filled recently? Yes  Is the patient out of the medication? Yes  Has the patient been seen for an appointment in the last year OR does the patient have an upcoming appointment? Yes  Can we respond through MyChart? Yes  Agent: Please be advised that Rx refills may take up to 3 business days. We ask that you follow-up with your pharmacy.

## 2023-03-12 ENCOUNTER — Other Ambulatory Visit: Payer: Self-pay

## 2023-03-12 MED ORDER — APIXABAN 5 MG PO TABS: 5.0000 mg | ORAL_TABLET | Freq: Two times a day (BID) | ORAL | 1 refills | Status: AC

## 2023-03-12 NOTE — Telephone Encounter (Signed)
Done

## 2023-03-13 ENCOUNTER — Ambulatory Visit: Payer: BLUE CROSS/BLUE SHIELD | Admitting: Family Medicine

## 2023-03-26 ENCOUNTER — Encounter: Payer: Self-pay | Admitting: Internal Medicine

## 2023-04-10 ENCOUNTER — Ambulatory Visit: Payer: 59 | Admitting: Family Medicine

## 2023-04-10 ENCOUNTER — Other Ambulatory Visit (HOSPITAL_COMMUNITY)
Admission: RE | Admit: 2023-04-10 | Discharge: 2023-04-10 | Disposition: A | Discharge status: Home / Self Care | Payer: 59 | Source: Ambulatory Visit | Attending: Family Medicine | Admitting: Family Medicine

## 2023-04-10 ENCOUNTER — Ambulatory Visit
Admission: RE | Admit: 2023-04-10 | Discharge: 2023-04-10 | Disposition: A | Discharge status: Home / Self Care | Payer: 59 | Source: Ambulatory Visit | Attending: Family Medicine | Admitting: Family Medicine

## 2023-04-10 VITALS — BP 147/87 | HR 97 | Wt 234.0 lb

## 2023-04-10 DIAGNOSIS — A5901 Trichomonal vulvovaginitis: Secondary | ICD-10-CM | POA: Diagnosis not present

## 2023-04-10 DIAGNOSIS — Z0000 Encounter for general adult medical examination without abnormal findings: Secondary | ICD-10-CM

## 2023-04-10 DIAGNOSIS — Z01419 Encounter for gynecological examination (general) (routine) without abnormal findings: Secondary | ICD-10-CM

## 2023-04-10 DIAGNOSIS — Z1211 Encounter for screening for malignant neoplasm of colon: Secondary | ICD-10-CM | POA: Diagnosis not present

## 2023-04-10 DIAGNOSIS — B9689 Other specified bacterial agents as the cause of diseases classified elsewhere: Secondary | ICD-10-CM | POA: Diagnosis not present

## 2023-04-10 DIAGNOSIS — N760 Acute vaginitis: Secondary | ICD-10-CM | POA: Diagnosis not present

## 2023-04-10 DIAGNOSIS — Z113 Encounter for screening for infections with a predominantly sexual mode of transmission: Secondary | ICD-10-CM | POA: Diagnosis present

## 2023-04-10 NOTE — Progress Notes (Signed)
 ANNUAL EXAM Patient name: Yvette Campos MRN [id number]  Date of birth: 04-Dec-1975 Chief Complaint:   No chief complaint on file.  History of Present Illness:   Yvette Campos is a 48 y.o. G38P1 African-American female being seen today for a routine annual exam.  Current complaints: No current concerns  Patient's last menstrual period was 03/29/2023 (approximate).   The pregnancy intention screening data noted above was reviewed. Potential methods of contraception were discussed. The patient elected to proceed with No data recorded.   Last pap 04/12/2020. Results were: NILM w/ HRHPV negative. H/O abnormal pap: no Last mammogram: 2 years ago.  Normal.  Scheduled for mammogram today Last colonoscopy: Cologuard 2024 was negative     04/10/2023    1:37 PM 02/12/2022    8:10 AM 05/03/2021    9:47 AM  Depression screen PHQ 2/9  Decreased Interest 0 0 1  Down, Depressed, Hopeless 0 0 0  PHQ - 2 Score 0 0 1  Altered sleeping 0 0   Tired, decreased energy 0 0   Change in appetite 0 1   Feeling bad or failure about yourself  0 0   Trouble concentrating 0 0   Moving slowly or fidgety/restless 0 0   Suicidal thoughts 0 0   PHQ-9 Score 0 1   Difficult doing work/chores  Not difficult at all         04/10/2023    1:38 PM 05/03/2021    9:47 AM  GAD 7 : Generalized Anxiety Score  Nervous, Anxious, on Edge 0 0  Control/stop worrying 0 0  Worry too much - different things 0 1  Trouble relaxing 0 0  Restless 0 0  Easily annoyed or irritable 0 1  Afraid - awful might happen 0 1  Total GAD 7 Score 0 3     Review of Systems:   Pertinent items are noted in HPI Denies any headaches, blurred vision, fatigue, shortness of breath, chest pain, abdominal pain, abnormal vaginal discharge/itching/odor/irritation, problems with periods, bowel movements, urination, or intercourse unless otherwise stated above. Pertinent History Reviewed:  Reviewed past medical,surgical, social and family history.   Reviewed problem list, medications and allergies. Physical Assessment:   Vitals:   04/10/23 1329 04/10/23 1331  BP: (!) 145/87 (!) 147/87  Pulse: (!) 101 97  Weight: 234 lb (106.1 kg)   Body mass index is 38.94 kg/m.        Physical Examination:   General appearance - well appearing, and in no distress  Mental status - alert, oriented to person, place, and time  Psych:  She has a normal mood and affect  Skin - warm and dry, normal color, no suspicious lesions noted  Chest - effort normal, all lung fields clear to auscultation bilaterally  Heart - normal rate and regular rhythm  Neck:  midline trachea, no thyromegaly or nodules  Abdomen - soft, nontender, nondistended, no masses or organomegaly  Pelvic - VULVA: normal appearing vulva with no masses, tenderness or lesions  VAGINA: normal appearing vagina with normal color and discharge with faint reddish tinge, no lesions  CERVIX: normal appearing cervix without discharge or lesions, no CMT  Thin prep pap is not done   UTERUS: uterus is felt to be normal size, shape, consistency and nontender   ADNEXA: No adnexal masses or tenderness noted.  Extremities:  No swelling or varicosities noted  Chaperone present for exam  No results found for this or any previous visit (from the past 24  hours).  Assessment & Plan:  1) Well-Woman Exam  2) STD screening and wet prep collected today.  Not due for Pap smear at this time  Labs/procedures today: Wet prep, HIV, RPR  Mammogram: Scheduled for today Colonoscopy: Negative Cologuard January 2024, repeat in 3 years  No orders of the defined types were placed in this encounter.   Meds: No orders of the defined types were placed in this encounter.   Follow-up: No follow-ups on file.  Celedonio Savage, MD 04/10/2023 1:59 PM

## 2023-04-10 NOTE — Progress Notes (Signed)
 Pt. Presents for annual. Pt has no questions or concerns at this time.

## 2023-04-11 ENCOUNTER — Ambulatory Visit (INDEPENDENT_AMBULATORY_CARE_PROVIDER_SITE_OTHER): Payer: BLUE CROSS/BLUE SHIELD | Admitting: Internal Medicine

## 2023-04-11 ENCOUNTER — Ambulatory Visit (INDEPENDENT_AMBULATORY_CARE_PROVIDER_SITE_OTHER): Payer: 59

## 2023-04-11 ENCOUNTER — Encounter: Payer: Self-pay | Admitting: Internal Medicine

## 2023-04-11 VITALS — BP 126/88 | HR 76 | Temp 98.3°F | Ht 65.0 in | Wt 233.0 lb

## 2023-04-11 DIAGNOSIS — Z86711 Personal history of pulmonary embolism: Secondary | ICD-10-CM | POA: Diagnosis not present

## 2023-04-11 LAB — COMPREHENSIVE METABOLIC PANEL
ALT: 5 U/L (ref 0–35)
AST: 13 U/L (ref 0–37)
Albumin: 4.1 g/dL (ref 3.5–5.2)
Alkaline Phosphatase: 64 U/L (ref 39–117)
BUN: 12 mg/dL (ref 6–23)
CO2: 27 meq/L (ref 19–32)
Calcium: 9 mg/dL (ref 8.4–10.5)
Chloride: 102 meq/L (ref 96–112)
Creatinine, Ser: 0.68 mg/dL (ref 0.40–1.20)
GFR: 103.46 mL/min (ref >60.00)
Glucose, Bld: 100 mg/dL — ABNORMAL HIGH (ref 70–99)
Potassium: 4.1 meq/L (ref 3.5–5.1)
Sodium: 137 meq/L (ref 135–145)
Total Bilirubin: 0.3 mg/dL (ref 0.2–1.2)
Total Protein: 7.8 g/dL (ref 6.0–8.3)

## 2023-04-11 LAB — CBC
HCT: 33.6 % — ABNORMAL LOW (ref 36.0–46.0)
Hemoglobin: 10.3 g/dL — ABNORMAL LOW (ref 12.0–15.0)
MCHC: 30.8 g/dL (ref 30.0–36.0)
MCV: 74 fl — ABNORMAL LOW (ref 78.0–100.0)
Platelets: 416 K/uL — ABNORMAL HIGH (ref 150.0–400.0)
RBC: 4.54 Mil/uL (ref 3.87–5.11)
RDW: 19.5 % — ABNORMAL HIGH (ref 11.5–15.5)
WBC: 6.5 K/uL (ref 4.0–10.5)

## 2023-04-11 NOTE — Patient Instructions (Signed)
We will check the labs and the chest x-ray today. ? ? ?

## 2023-04-11 NOTE — Assessment & Plan Note (Signed)
 Checking hypercoagulable workup today as well as d-dimer and cbc and cmp. If d-dimer negative will stop eliquis. If still positive will continue eliquis for 3 more months. Given her cough we will check CXR but this could be related to scar tissue given the extent of PE.

## 2023-04-11 NOTE — Progress Notes (Signed)
   Subjective:   Patient ID: Yvette Campos, female    DOB: Jan 27, 1976, 48 y.o.   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 Still taking anticoagulation and needs labs to assess for hypercoagulable status and d-dimer. Still getting SOB with exertion and with talking extended she is getting coughing.   Review of Systems  Constitutional: Negative.   HENT: Negative.    Eyes: Negative.   Respiratory:  Positive for cough and shortness of breath. Negative for chest tightness.   Cardiovascular:  Negative for chest pain, palpitations and leg swelling.  Gastrointestinal:  Negative for abdominal distention, abdominal pain, constipation, diarrhea, nausea and vomiting.  Musculoskeletal: Negative.   Skin: Negative.   Neurological: Negative.   Psychiatric/Behavioral: Negative.      Objective:  Physical Exam Constitutional:      Appearance: She is well-developed.  HENT:     Head: Normocephalic and atraumatic.  Cardiovascular:     Rate and Rhythm: Normal rate and regular rhythm.  Pulmonary:     Effort: Pulmonary effort is normal. No respiratory distress.     Breath sounds: Normal breath sounds. No wheezing or rales.  Abdominal:     General: Bowel sounds are normal. There is no distension.     Palpations: Abdomen is soft.     Tenderness: There is no abdominal tenderness. There is no rebound.  Musculoskeletal:     Cervical back: Normal range of motion.  Skin:    General: Skin is warm and dry.  Neurological:     Mental Status: She is alert and oriented to person, place, and time.     Coordination: Coordination normal.     Vitals:   04/11/23 1130  BP: 126/88  Pulse: 76  Temp: 98.3 F (36.8 C)  TempSrc: Oral  SpO2: 99%  Weight: 233 lb (105.7 kg)  Height: 5\' 5"  (1.651 m)    Assessment & Plan:  Visit time 25 minutes in face to face communication with patient and coordination of care, additional 5 minutes spent in record review, coordination  or care, ordering tests, communicating/referring to other healthcare professionals, documenting in medical records all on the same day of the visit for total time 30 minutes spent on the visit.

## 2023-04-12 LAB — RPR: RPR Ser Ql: NONREACTIVE

## 2023-04-12 LAB — HIV ANTIBODY (ROUTINE TESTING W REFLEX): HIV Screen 4th Generation wRfx: NONREACTIVE

## 2023-04-13 LAB — CERVICOVAGINAL ANCILLARY ONLY
Bacterial Vaginitis (gardnerella): POSITIVE — AB
Candida Glabrata: NEGATIVE
Candida Vaginitis: NEGATIVE
Chlamydia: NEGATIVE
Comment: NEGATIVE
Comment: NEGATIVE
Comment: NEGATIVE
Comment: NEGATIVE
Comment: NEGATIVE
Comment: NORMAL
Neisseria Gonorrhea: NEGATIVE
Trichomonas: POSITIVE — AB

## 2023-04-13 LAB — BETA-2-GLYCOPROTEIN I ABS, IGG/M/A
Beta-2 Glyco 1 IgA: <9 GPI IgA units (ref 0–25)
Beta-2 Glyco 1 IgM: 16 GPI IgM units (ref 0–32)
Beta-2 Glyco I IgG: <9 GPI IgG units (ref 0–20)

## 2023-04-13 LAB — LUPUS ANTICOAGULANT PANEL
Dilute Viper Venom Time: 37.5 s (ref 0.0–47.0)
PTT Lupus Anticoagulant: 39.5 s (ref 0.0–43.5)

## 2023-04-15 ENCOUNTER — Encounter: Payer: Self-pay | Admitting: Family Medicine

## 2023-04-15 MED ORDER — METRONIDAZOLE 500 MG PO TABS: 500.0000 mg | ORAL_TABLET | Freq: Two times a day (BID) | ORAL | 0 refills | Status: AC

## 2023-04-15 NOTE — Telephone Encounter (Signed)
 FYI

## 2023-04-15 NOTE — Addendum Note (Signed)
 Addended by: Celedonio Savage on: 04/15/2023 08:56 AM   Modules accepted: Orders

## 2023-04-16 ENCOUNTER — Encounter: Payer: Self-pay | Admitting: Family Medicine

## 2023-04-17 LAB — CARDIOLIPIN ANTIBODIES, IGG, IGM, IGA
Anticardiolipin IgA: <2 [APL'U]/mL (ref <20.0)
Anticardiolipin IgG: <2 [GPL'U]/mL (ref <20.0)
Anticardiolipin IgM: 2.8 [MPL'U]/mL (ref <20.0)

## 2023-04-17 LAB — PROTEIN C ACTIVITY: Protein C Activity: 82 %{normal} (ref 70–180)

## 2023-04-17 LAB — D-DIMER, QUANTITATIVE: D-Dimer, Quant: 0.39 ug{FEU}/mL (ref <0.50)

## 2023-04-17 LAB — PROTEIN C, TOTAL: Protein C Antigen: 97 %{normal} (ref 70–140)

## 2023-04-17 LAB — PROTEIN S, TOTAL: PROTEIN S ANTIGEN, TOTAL: 102 %{normal} (ref 70–140)

## 2023-04-17 LAB — FACTOR 5 LEIDEN: Result: NEGATIVE

## 2023-04-17 LAB — PROTHROMBIN GENE MUTATION: PROTHROMBIN (FACTOR II): NEGATIVE

## 2023-04-17 LAB — PROTEIN S ACTIVITY: Protein S Activity: 50 %{normal} — ABNORMAL LOW (ref 60–140)

## 2023-04-17 LAB — ANTITHROMBIN III: AntiThromb III Func: 98 %{normal} (ref 80–135)

## 2023-04-17 LAB — HOMOCYSTEINE: Homocysteine: 11.5 umol/L — ABNORMAL HIGH (ref <10.4)

## 2023-04-23 ENCOUNTER — Encounter: Payer: Self-pay | Admitting: Internal Medicine

## 2023-04-24 NOTE — Telephone Encounter (Signed)
 I have filled out what I could please review, placed inside office box

## 2023-04-28 ENCOUNTER — Encounter: Payer: Self-pay | Admitting: Internal Medicine

## 2023-07-08 ENCOUNTER — Encounter: Payer: Self-pay | Admitting: Internal Medicine

## 2023-07-09 ENCOUNTER — Encounter: Payer: Self-pay | Admitting: Internal Medicine

## 2023-07-09 NOTE — Telephone Encounter (Signed)
 Ok to do

## 2024-01-01 IMAGING — MG MM DIGITAL DIAGNOSTIC UNILAT*L* W/ TOMO W/ CAD
4 series · 4 of 12 positions shown · non-contrast
Comparison: Previous exam(s).

CLINICAL DATA: Recall from screening mammography, possible focal
asymmetry involving the inner LEFT breast at anterior to middle
depth.

EXAM:
DIGITAL DIAGNOSTIC UNILATERAL LEFT MAMMOGRAM WITH TOMOSYNTHESIS AND
CAD; ULTRASOUND LEFT BREAST LIMITED
TECHNIQUE: Left digital diagnostic mammography and breast tomosynthesis was
performed. The images were evaluated with computer-aided detection.;
Targeted ultrasound examination of the left breast was performed.

[L MLO synth-2D]
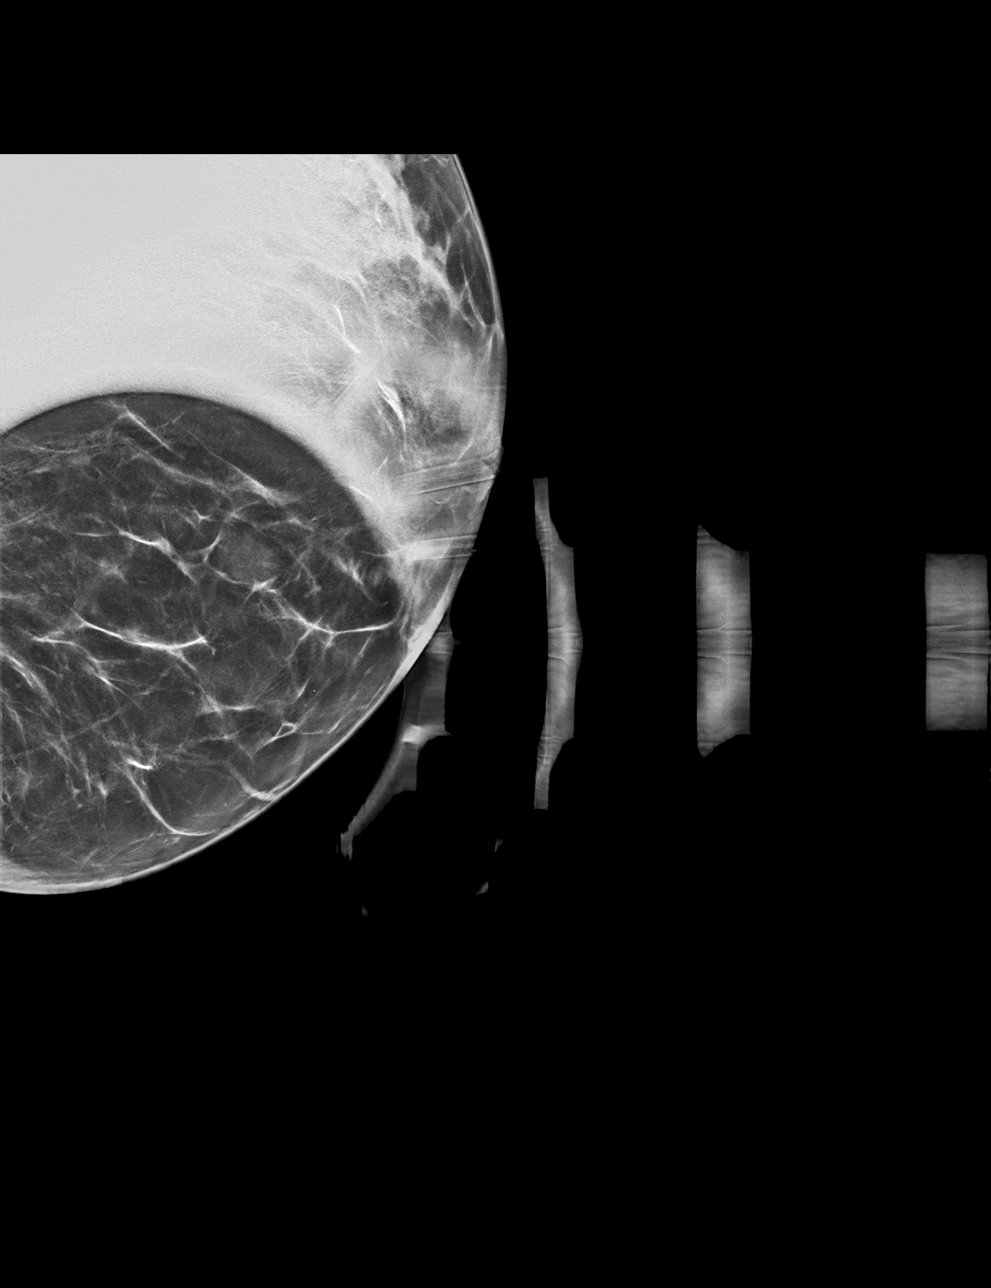

[L CC synth-2D]
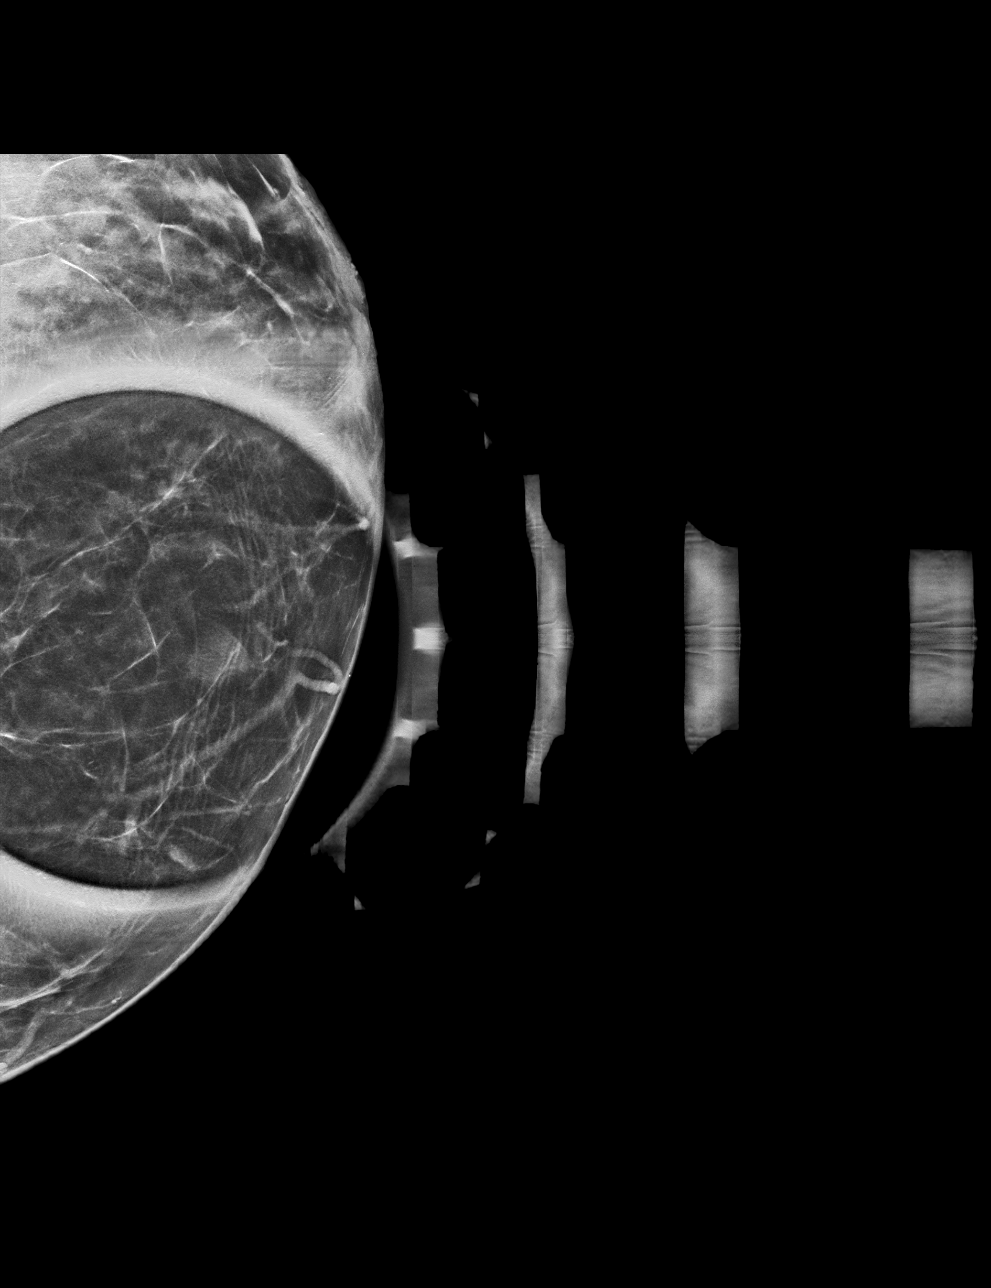

[L CC tomo · tomo slice 27/53.0]
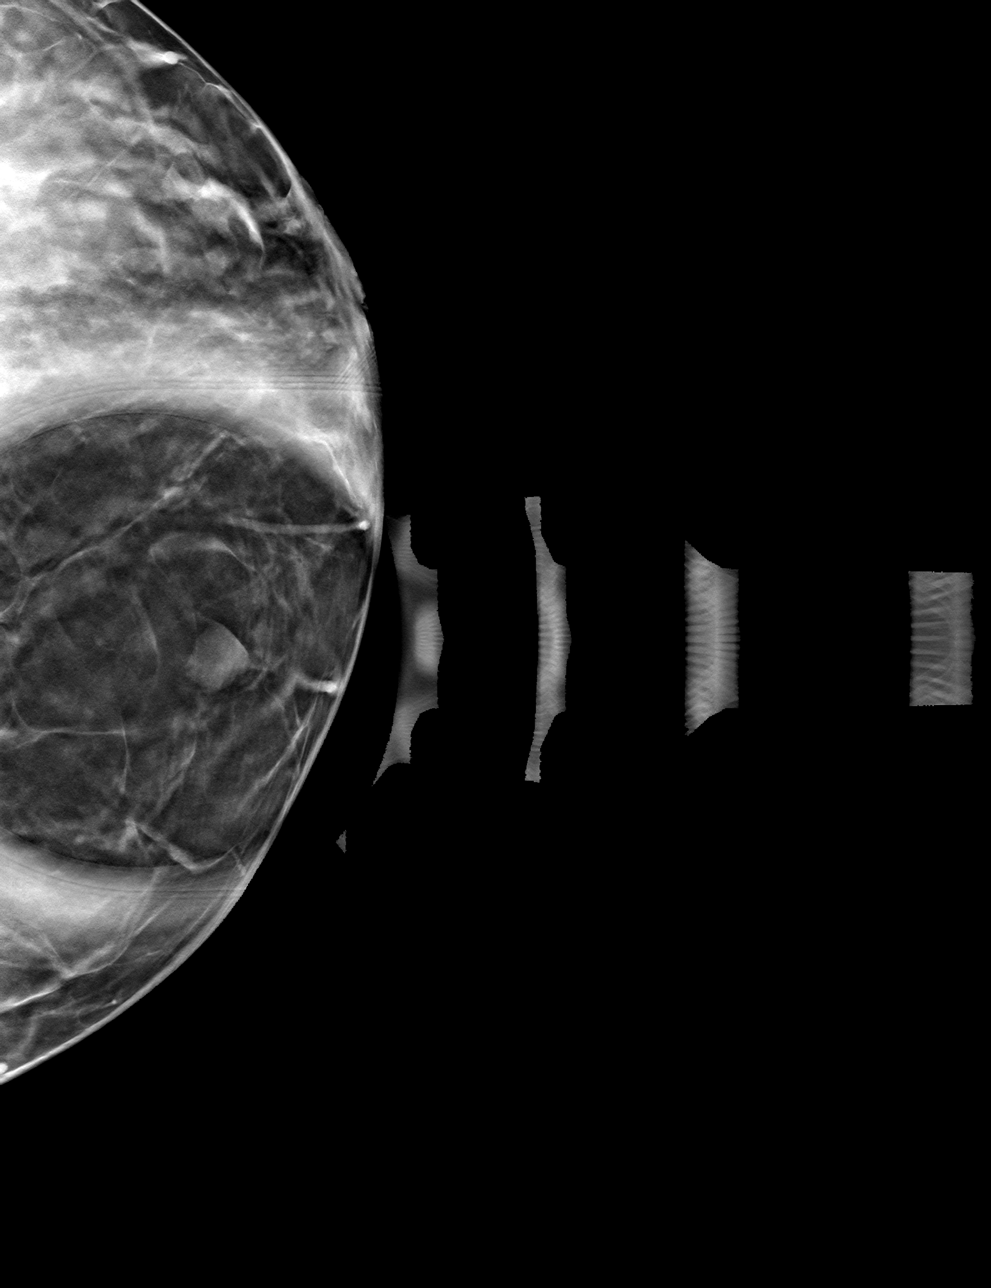

[L MLO tomo · tomo slice 31/61.0]
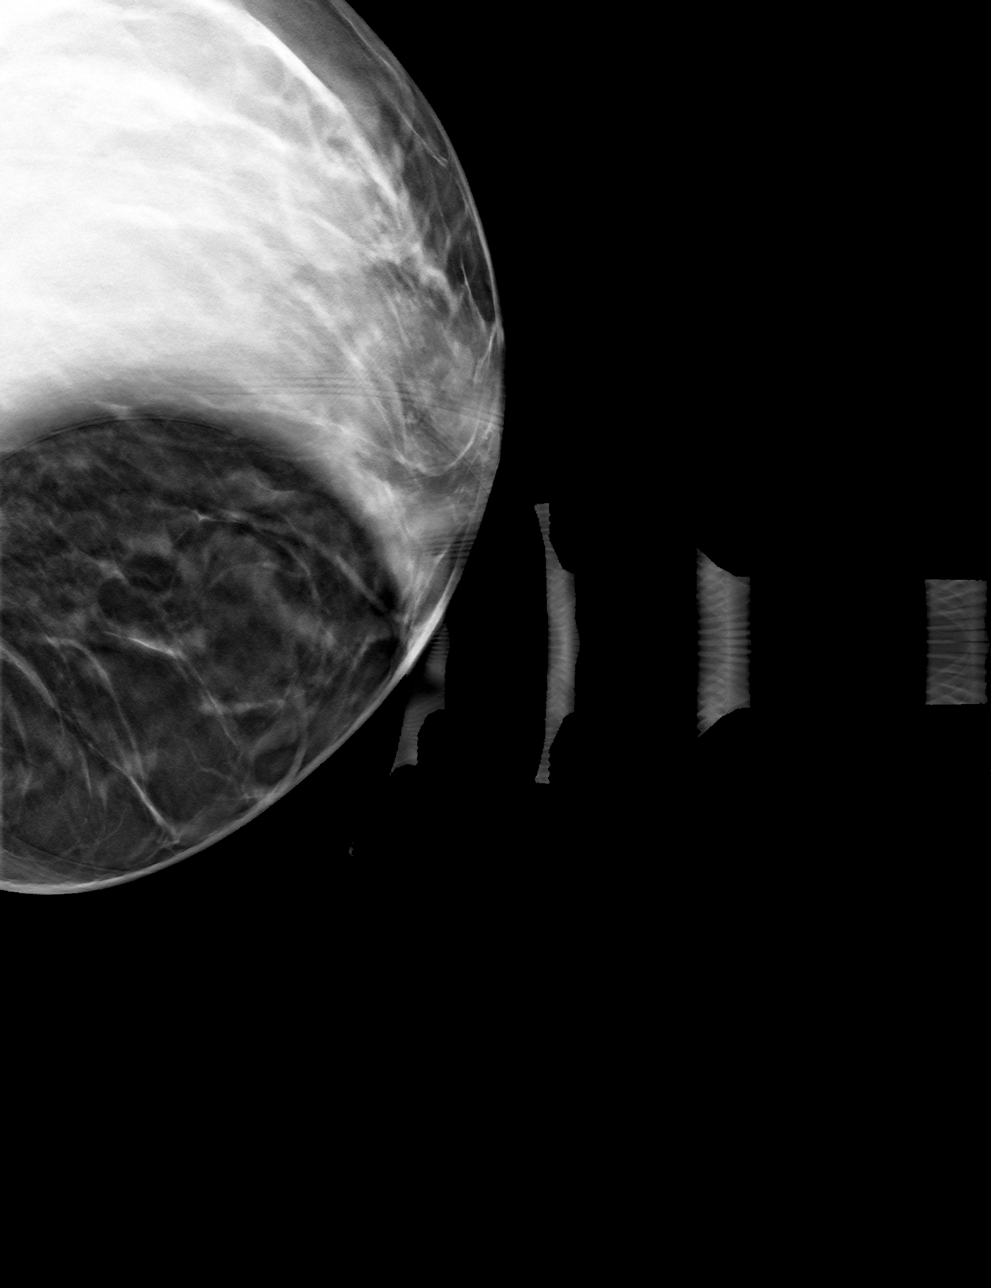

[4 of 12 positions shown; findings below may reference images not displayed]

ACR Breast Density Category b: There are scattered areas of
fibroglandular density.
FINDINGS: Spot-compression CC and MLO views of the area of concern were
obtained.

The focal asymmetry persists on the spot compression views and may
represent an isodense mass measuring approximately 1 cm. There is no
associated architectural distortion or suspicious calcifications.

Targeted ultrasound is performed, demonstrating benign clustered
cysts at the 9 o'clock position 4 cm from the nipple at anterior
depth, measuring approximately 1.0 x 0.5 x 0.9 cm, demonstrating
posterior acoustic enhancement and no internal power Doppler flow,
corresponding to the screening mammographic finding. No suspicious
solid mass or abnormal acoustic shadowing is identified.

Sonographic evaluation of the LEFT axilla demonstrates no pathologic
lymphadenopathy.
IMPRESSION: 1. No mammographic or sonographic evidence of malignancy involving
the LEFT breast.
2. 1.0 cm benign clustered cysts in the inner LEFT breast at 9
o'clock 4 cm from the nipple which account for the screening
mammographic finding.

RECOMMENDATION:
Screening mammogram in one year.(Code:[id number]-[id number])

I have discussed the findings and recommendations with the patient.
If applicable, a reminder letter will be sent to the patient
regarding the next appointment.

BI-RADS CATEGORY  2: Benign.

## 2024-01-01 IMAGING — US US BREAST*L* LIMITED INC AXILLA
1 series · 7 of 7 positions shown · non-contrast
Comparison: Previous exam(s).

CLINICAL DATA: Recall from screening mammography, possible focal
asymmetry involving the inner LEFT breast at anterior to middle
depth.

EXAM:
DIGITAL DIAGNOSTIC UNILATERAL LEFT MAMMOGRAM WITH TOMOSYNTHESIS AND
CAD; ULTRASOUND LEFT BREAST LIMITED
TECHNIQUE: Left digital diagnostic mammography and breast tomosynthesis was
performed. The images were evaluated with computer-aided detection.;
Targeted ultrasound examination of the left breast was performed.

[Series 1: us breast*left* limited inc axilla · 0.06mm/px · 7 of 7 slices shown]
[im 1/7]
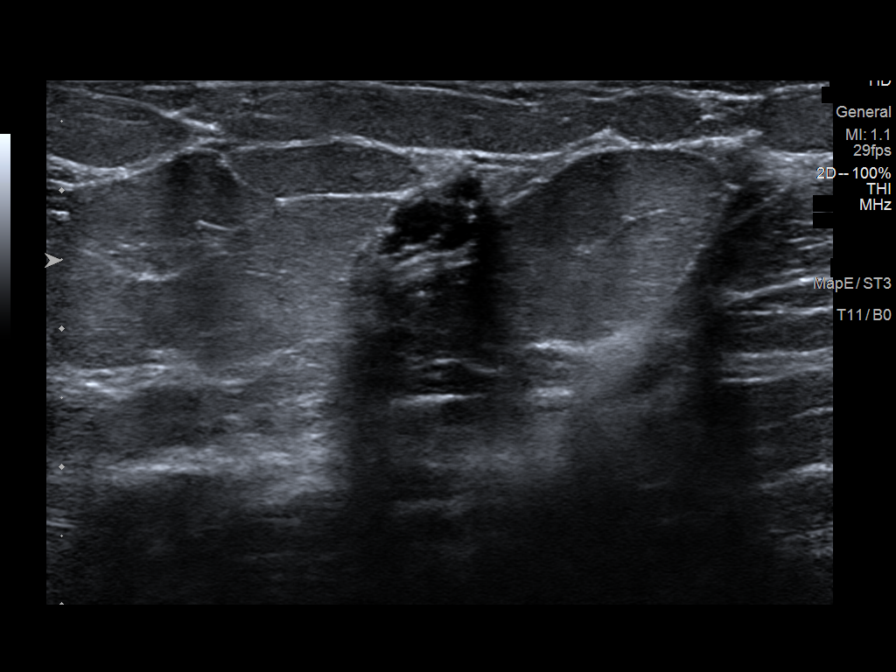
[im 2/7]
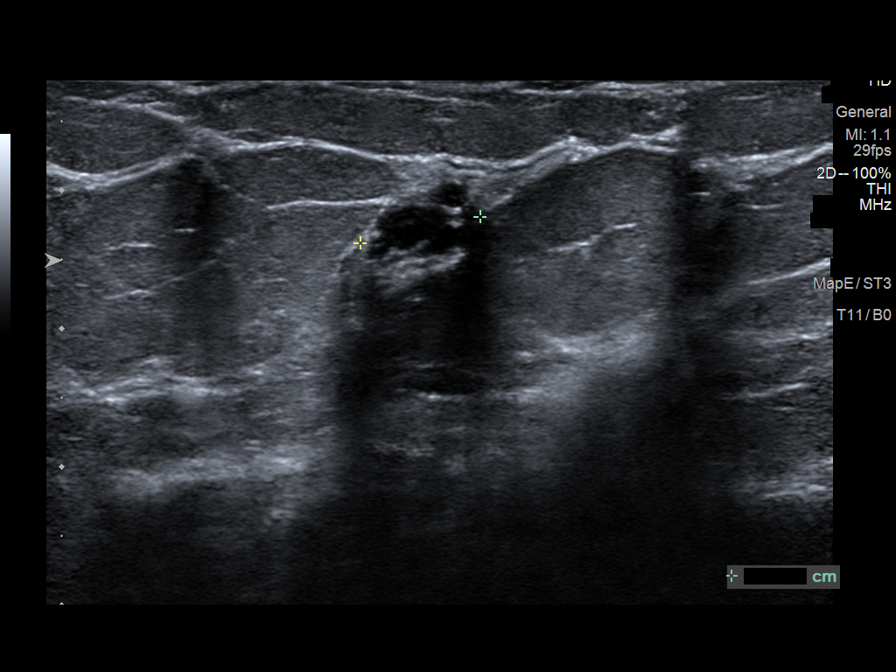
[im 3/7]
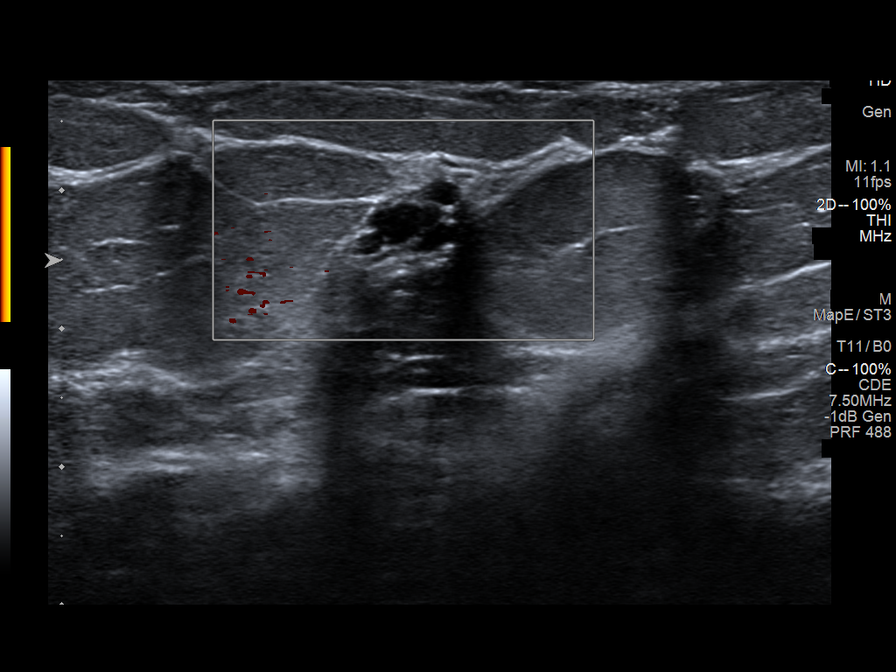
[im 4/7]
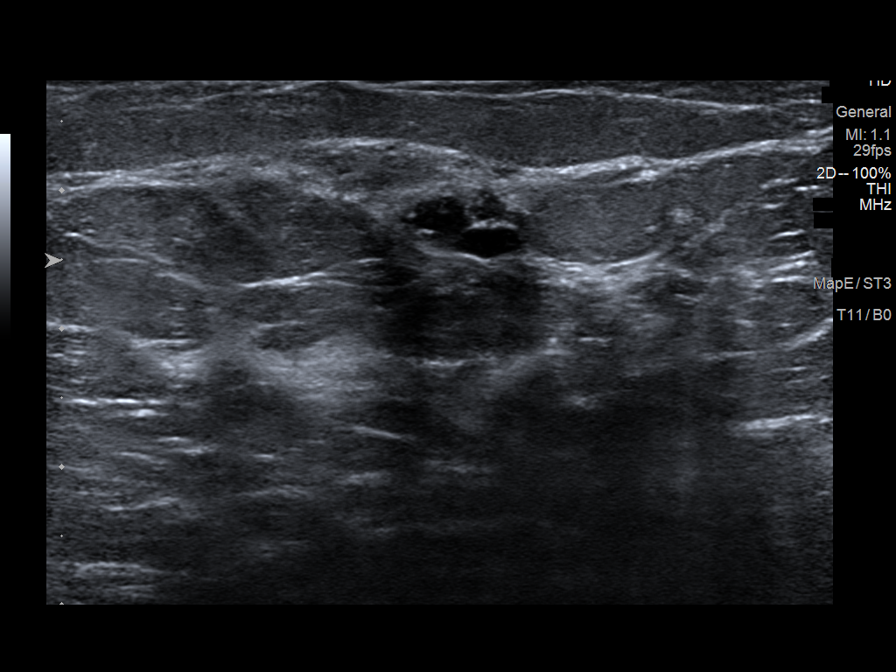
[im 5/7]
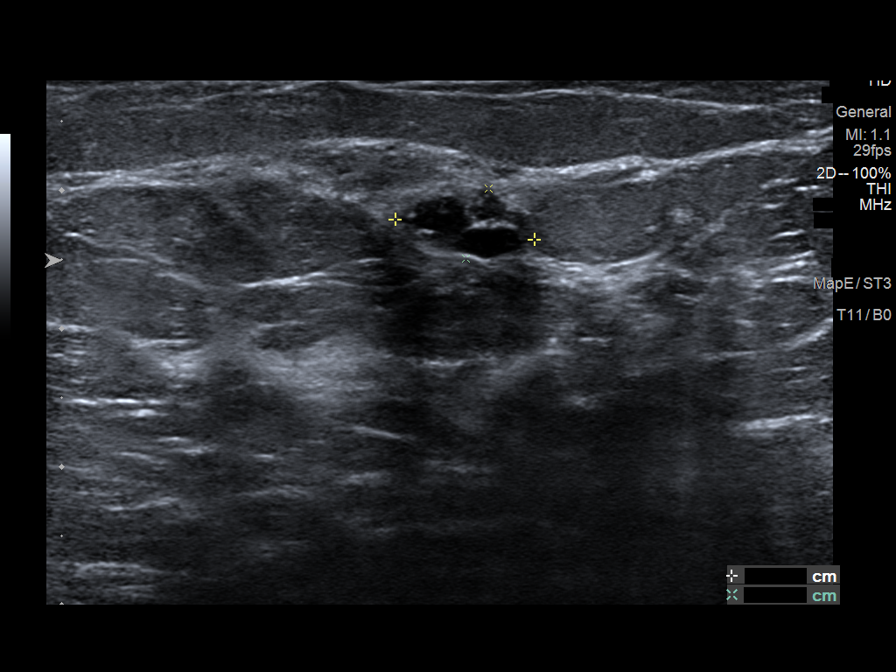
[im 6/7]
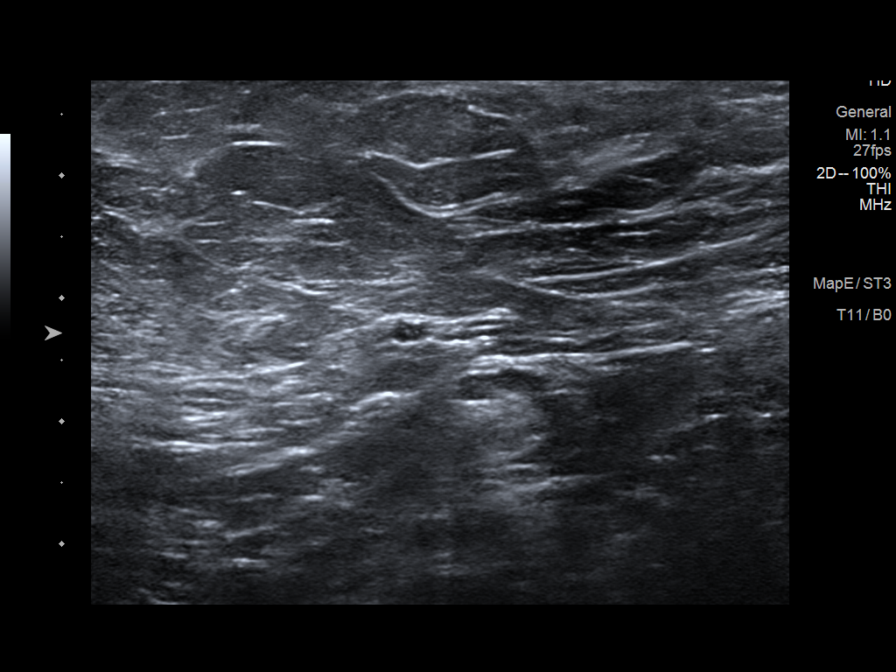
[im 7/7]
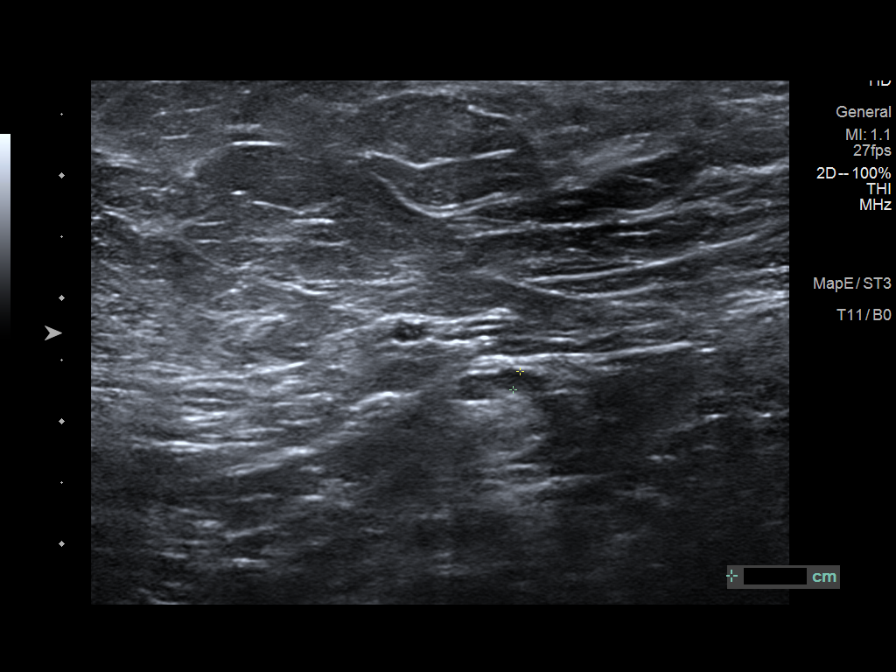

[7 of 7 positions shown; findings below may reference images not displayed]

ACR Breast Density Category b: There are scattered areas of
fibroglandular density.
FINDINGS: Spot-compression CC and MLO views of the area of concern were
obtained.

The focal asymmetry persists on the spot compression views and may
represent an isodense mass measuring approximately 1 cm. There is no
associated architectural distortion or suspicious calcifications.

Targeted ultrasound is performed, demonstrating benign clustered
cysts at the 9 o'clock position 4 cm from the nipple at anterior
depth, measuring approximately 1.0 x 0.5 x 0.9 cm, demonstrating
posterior acoustic enhancement and no internal power Doppler flow,
corresponding to the screening mammographic finding. No suspicious
solid mass or abnormal acoustic shadowing is identified.

Sonographic evaluation of the LEFT axilla demonstrates no pathologic
lymphadenopathy.
IMPRESSION: 1. No mammographic or sonographic evidence of malignancy involving
the LEFT breast.
2. 1.0 cm benign clustered cysts in the inner LEFT breast at 9
o'clock 4 cm from the nipple which account for the screening
mammographic finding.

RECOMMENDATION:
Screening mammogram in one year.(Code:[id number])

I have discussed the findings and recommendations with the patient.
If applicable, a reminder letter will be sent to the patient
regarding the next appointment.

BI-RADS CATEGORY  2: Benign.
